# Patient Record
Sex: Female | Born: 1935 | Race: White | Hispanic: No | Smoking: Never smoker
Health system: Southern US, Community
[De-identification: ages and names within clinical notes are randomized; demographics above are authoritative.]

## PROBLEM LIST (undated history)

## (undated) DIAGNOSIS — I6529 Occlusion and stenosis of unspecified carotid artery: Secondary | ICD-10-CM

## (undated) DIAGNOSIS — I10 Essential (primary) hypertension: Secondary | ICD-10-CM

## (undated) DIAGNOSIS — C569 Malignant neoplasm of unspecified ovary: Secondary | ICD-10-CM

## (undated) HISTORY — PX: OOPHORECTOMY: SHX86

## (undated) HISTORY — PX: ABDOMINAL HYSTERECTOMY: SHX81

## (undated) HISTORY — DX: Occlusion and stenosis of unspecified carotid artery: I65.29

## (undated) HISTORY — PX: BREAST BIOPSY: SHX20

---

## 2004-04-13 ENCOUNTER — Ambulatory Visit: Payer: Self-pay | Admitting: Internal Medicine

## 2005-01-10 ENCOUNTER — Ambulatory Visit: Payer: Self-pay | Admitting: Internal Medicine

## 2005-05-02 ENCOUNTER — Ambulatory Visit: Payer: Self-pay | Admitting: General Surgery

## 2006-05-06 ENCOUNTER — Ambulatory Visit: Payer: Self-pay | Admitting: Internal Medicine

## 2006-07-22 ENCOUNTER — Ambulatory Visit: Payer: Self-pay | Admitting: Gastroenterology

## 2007-05-09 ENCOUNTER — Ambulatory Visit: Payer: Self-pay | Admitting: Internal Medicine

## 2008-05-10 ENCOUNTER — Ambulatory Visit: Payer: Self-pay | Admitting: Family Medicine

## 2009-05-23 ENCOUNTER — Ambulatory Visit: Payer: Self-pay | Admitting: Family Medicine

## 2010-05-29 ENCOUNTER — Ambulatory Visit: Payer: Self-pay | Admitting: Family Medicine

## 2010-06-14 ENCOUNTER — Ambulatory Visit: Payer: Self-pay | Admitting: Ophthalmology

## 2011-09-04 ENCOUNTER — Ambulatory Visit: Payer: Self-pay | Admitting: Family Medicine

## 2012-09-09 ENCOUNTER — Ambulatory Visit: Payer: Self-pay | Admitting: Family Medicine

## 2013-09-10 ENCOUNTER — Ambulatory Visit: Payer: Self-pay | Admitting: Family Medicine

## 2013-10-09 DIAGNOSIS — M129 Arthropathy, unspecified: Secondary | ICD-10-CM | POA: Insufficient documentation

## 2013-10-09 DIAGNOSIS — E785 Hyperlipidemia, unspecified: Secondary | ICD-10-CM | POA: Insufficient documentation

## 2013-10-09 DIAGNOSIS — I10 Essential (primary) hypertension: Secondary | ICD-10-CM | POA: Insufficient documentation

## 2013-10-09 DIAGNOSIS — F419 Anxiety disorder, unspecified: Secondary | ICD-10-CM | POA: Insufficient documentation

## 2014-02-17 DIAGNOSIS — B0229 Other postherpetic nervous system involvement: Secondary | ICD-10-CM | POA: Insufficient documentation

## 2014-09-01 ENCOUNTER — Other Ambulatory Visit: Payer: Self-pay | Admitting: Family Medicine

## 2014-09-01 DIAGNOSIS — Z1231 Encounter for screening mammogram for malignant neoplasm of breast: Secondary | ICD-10-CM

## 2014-09-16 ENCOUNTER — Ambulatory Visit
Admission: RE | Admit: 2014-09-16 | Discharge: 2014-09-16 | Disposition: A | Discharge status: Home / Self Care | Payer: PPO | Source: Ambulatory Visit | Attending: Family Medicine | Admitting: Family Medicine

## 2014-09-16 DIAGNOSIS — Z1231 Encounter for screening mammogram for malignant neoplasm of breast: Secondary | ICD-10-CM | POA: Insufficient documentation

## 2014-09-16 HISTORY — DX: Malignant neoplasm of unspecified ovary: C56.9

## 2015-04-29 ENCOUNTER — Encounter: Payer: Self-pay | Admitting: Emergency Medicine

## 2015-04-29 ENCOUNTER — Ambulatory Visit
Admission: EM | Admit: 2015-04-29 | Discharge: 2015-04-29 | Disposition: A | Discharge status: Other Acute Inpatient Hospital | Payer: PPO | Attending: Family Medicine | Admitting: Family Medicine

## 2015-04-29 DIAGNOSIS — S0990XA Unspecified injury of head, initial encounter: Secondary | ICD-10-CM | POA: Diagnosis not present

## 2015-04-29 NOTE — ED Notes (Signed)
Patient states that she was going up the steps and tripped and hit her forehead on the cement floor.  Patient denies HAs.  Patient denies N/V.  Patient c/o pain on the left side of her forehead above her eyebrow.

## 2015-04-29 NOTE — ED Notes (Signed)
Ice Pack applied to forehead.

## 2015-04-29 NOTE — ED Provider Notes (Signed)
Patient presents today after fall outside on cement. Patient states that she tripped about an hour and a half ago outside. She has moderate amount of swelling above her left eye however does not complain of gout much pain. She denies any headache, nausea, vomiting, chest pain, shortness of breath, vision problems. She does take an aspirin daily. She also admits to having a abrasion and small laceration to her right hand. She denies any neck pain or any back pain or hip pain.  ROS: Negative except mentioned above.  Vitals as per Epic.  GENERAL: NAD HEENT: moderate sized hematoma above left eye, PERRL, EOMI, no neck tenderness, no orbital step-off appreciated  RESP: CTA B CARD: RRR SKIN: approx. 0.5in laceration to the right first digit, abrasion along volar aspect of wrist MSK: no extremity tenderness, no back tenderness or hip tenderness NEURO: AAOx3, CN II-XII grossly intact   A/P: Hx of Fall, head/facial trauma, skin laceration/abrasion right hand- discussed with patient that I would recommend a CT of head and maxillofacial area, she will need to go to the ER to have this done, I believe she will need sutures to the right first digit, a dressing was placed over the area, there is someone with her that will take her to the ER now.   Paulina Fusi, MD 04/29/15 [phone]

## 2015-08-18 ENCOUNTER — Other Ambulatory Visit: Payer: Self-pay | Admitting: Family Medicine

## 2015-08-18 DIAGNOSIS — Z1231 Encounter for screening mammogram for malignant neoplasm of breast: Secondary | ICD-10-CM

## 2015-08-25 DIAGNOSIS — E7800 Pure hypercholesterolemia, unspecified: Secondary | ICD-10-CM | POA: Diagnosis not present

## 2015-08-25 DIAGNOSIS — Z23 Encounter for immunization: Secondary | ICD-10-CM | POA: Diagnosis not present

## 2015-08-25 DIAGNOSIS — M129 Arthropathy, unspecified: Secondary | ICD-10-CM | POA: Diagnosis not present

## 2015-08-25 DIAGNOSIS — I10 Essential (primary) hypertension: Secondary | ICD-10-CM | POA: Diagnosis not present

## 2015-08-25 DIAGNOSIS — B0229 Other postherpetic nervous system involvement: Secondary | ICD-10-CM | POA: Diagnosis not present

## 2015-08-25 DIAGNOSIS — F419 Anxiety disorder, unspecified: Secondary | ICD-10-CM | POA: Diagnosis not present

## 2015-09-19 ENCOUNTER — Ambulatory Visit
Admission: RE | Admit: 2015-09-19 | Discharge: 2015-09-19 | Disposition: A | Discharge status: Home / Self Care | Payer: PPO | Source: Ambulatory Visit | Attending: Family Medicine | Admitting: Family Medicine

## 2015-09-19 DIAGNOSIS — Z1231 Encounter for screening mammogram for malignant neoplasm of breast: Secondary | ICD-10-CM | POA: Diagnosis not present

## 2016-02-27 DIAGNOSIS — I10 Essential (primary) hypertension: Secondary | ICD-10-CM | POA: Diagnosis not present

## 2016-02-27 DIAGNOSIS — F419 Anxiety disorder, unspecified: Secondary | ICD-10-CM | POA: Diagnosis not present

## 2016-02-27 DIAGNOSIS — E7800 Pure hypercholesterolemia, unspecified: Secondary | ICD-10-CM | POA: Diagnosis not present

## 2016-02-27 DIAGNOSIS — M129 Arthropathy, unspecified: Secondary | ICD-10-CM | POA: Diagnosis not present

## 2016-02-27 DIAGNOSIS — B0229 Other postherpetic nervous system involvement: Secondary | ICD-10-CM | POA: Diagnosis not present

## 2016-08-27 ENCOUNTER — Other Ambulatory Visit: Payer: Self-pay | Admitting: Family Medicine

## 2016-08-27 DIAGNOSIS — M129 Arthropathy, unspecified: Secondary | ICD-10-CM | POA: Diagnosis not present

## 2016-08-27 DIAGNOSIS — I10 Essential (primary) hypertension: Secondary | ICD-10-CM | POA: Diagnosis not present

## 2016-08-27 DIAGNOSIS — Z1231 Encounter for screening mammogram for malignant neoplasm of breast: Secondary | ICD-10-CM

## 2016-08-27 DIAGNOSIS — E7800 Pure hypercholesterolemia, unspecified: Secondary | ICD-10-CM | POA: Diagnosis not present

## 2016-08-27 DIAGNOSIS — B0229 Other postherpetic nervous system involvement: Secondary | ICD-10-CM | POA: Diagnosis not present

## 2016-08-27 DIAGNOSIS — F419 Anxiety disorder, unspecified: Secondary | ICD-10-CM | POA: Diagnosis not present

## 2016-09-19 ENCOUNTER — Ambulatory Visit
Admission: RE | Admit: 2016-09-19 | Discharge: 2016-09-19 | Disposition: A | Discharge status: Home / Self Care | Payer: PPO | Source: Ambulatory Visit | Attending: Family Medicine | Admitting: Family Medicine

## 2016-09-19 DIAGNOSIS — Z1231 Encounter for screening mammogram for malignant neoplasm of breast: Secondary | ICD-10-CM | POA: Diagnosis not present

## 2016-11-28 ENCOUNTER — Encounter: Payer: Self-pay | Admitting: Emergency Medicine

## 2016-11-28 ENCOUNTER — Emergency Department: Payer: PPO

## 2016-11-28 ENCOUNTER — Emergency Department
Admission: EM | Admit: 2016-11-28 | Discharge: 2016-11-28 | Disposition: A | Discharge status: Home / Self Care | Payer: PPO | Attending: Emergency Medicine | Admitting: Emergency Medicine

## 2016-11-28 DIAGNOSIS — I10 Essential (primary) hypertension: Secondary | ICD-10-CM | POA: Diagnosis not present

## 2016-11-28 DIAGNOSIS — R42 Dizziness and giddiness: Secondary | ICD-10-CM | POA: Insufficient documentation

## 2016-11-28 DIAGNOSIS — Z79899 Other long term (current) drug therapy: Secondary | ICD-10-CM | POA: Insufficient documentation

## 2016-11-28 DIAGNOSIS — R55 Syncope and collapse: Secondary | ICD-10-CM | POA: Diagnosis present

## 2016-11-28 DIAGNOSIS — Z7982 Long term (current) use of aspirin: Secondary | ICD-10-CM | POA: Insufficient documentation

## 2016-11-28 DIAGNOSIS — R112 Nausea with vomiting, unspecified: Secondary | ICD-10-CM | POA: Diagnosis not present

## 2016-11-28 HISTORY — DX: Essential (primary) hypertension: I10

## 2016-11-28 LAB — URINALYSIS, COMPLETE (UACMP) WITH MICROSCOPIC
Bacteria, UA: NONE SEEN
Bilirubin Urine: NEGATIVE
Glucose, UA: NEGATIVE mg/dL
Hgb urine dipstick: NEGATIVE
Ketones, ur: 5 mg/dL — AB
Nitrite: NEGATIVE
PH: 5 (ref 5.0–8.0)
Protein, ur: NEGATIVE mg/dL
SPECIFIC GRAVITY, URINE: 1.016 (ref 1.005–1.030)

## 2016-11-28 LAB — BASIC METABOLIC PANEL
Anion gap: 10 (ref 5–15)
BUN: 29 mg/dL — ABNORMAL HIGH (ref 6–20)
CALCIUM: 9 mg/dL (ref 8.9–10.3)
CHLORIDE: 105 mmol/L (ref 101–111)
CO2: 23 mmol/L (ref 22–32)
CREATININE: 1.02 mg/dL — AB (ref 0.44–1.00)
GFR calc Af Amer: 57 mL/min — ABNORMAL LOW (ref >60)
GFR calc non Af Amer: 49 mL/min — ABNORMAL LOW (ref >60)
Glucose, Bld: 121 mg/dL — ABNORMAL HIGH (ref 65–99)
Potassium: 4.5 mmol/L (ref 3.5–5.1)
SODIUM: 138 mmol/L (ref 135–145)

## 2016-11-28 LAB — CBC
HCT: 42.1 % (ref 35.0–47.0)
Hemoglobin: 14.4 g/dL (ref 12.0–16.0)
MCH: 30.3 pg (ref 26.0–34.0)
MCHC: 34.1 g/dL (ref 32.0–36.0)
MCV: 88.8 fL (ref 80.0–100.0)
PLATELETS: 297 10*3/uL (ref 150–440)
RBC: 4.74 MIL/uL (ref 3.80–5.20)
RDW: 13.4 % (ref 11.5–14.5)
WBC: 10.5 10*3/uL (ref 3.6–11.0)

## 2016-11-28 NOTE — ED Provider Notes (Signed)
[facility] Emergency Department Provider Note  ____________________________________________  Time seen: Approximately 2:40 PM  I have reviewed the triage vital signs and the nursing notes.   HISTORY  Chief Complaint Near Syncope    HPI Dominique King is a 83 y.o. female who reports a brief episode of sudden dizziness described as room spinning earlier today at about 1 PM. She had just eaten and was standing in the kitchen doing some dishes when this happened. She sat down and the episode passed within a few minutes. She did vomit once or twice. This is happened before after eating including once last week. She denies any headache vision change focal paresthesias or weakness. She did not pass out or hit her head. Denies any fevers chills or neck stiffness. No pain anywhere else. Feeling back to normal and wants ginger ale.     Past Medical History:  Diagnosis Date  . Hypertension   . Ovarian cancer ([place])      There are no active problems to display for this patient.    Past Surgical History:  Procedure Laterality Date  . ABDOMINAL HYSTERECTOMY    . BREAST BIOPSY Right    neg-1994  . OOPHORECTOMY       Prior to Admission medications   Medication Sig Start Date End Date Taking? Authorizing Provider  aspirin 81 MG tablet Take 81 mg by mouth daily.    [provider]  gabapentin (NEURONTIN) 300 MG capsule Take 300 mg by mouth at bedtime.    [provider]  Omega-3 Fatty Acids (FISH OIL ADULT GUMMIES PO) Take by mouth.    [provider]  verapamil (VERELAN PM) 240 MG 24 hr capsule Take 240 mg by mouth at bedtime.    [provider]     Allergies Patient has no known allergies.   Family History  Problem Relation Age of Onset  . Breast cancer Mother 53    Social History Social History  Substance Use Topics  . Smoking status: Never Smoker  . Smokeless tobacco: Never Used  . Alcohol use No     Review of Systems  Constitutional:   No fever or chills.  ENT:   No sore throat. No rhinorrhea. Cardiovascular:   No chest pain or syncope. Respiratory:   No dyspnea or cough. Gastrointestinal:   Negative for abdominal pain, vomiting and diarrhea.  Musculoskeletal:   Negative for focal pain or swelling All other systems reviewed and are negative except as documented above in ROS and HPI.  ____________________________________________   PHYSICAL EXAM:  VITAL SIGNS: ED Triage Vitals  Enc Vitals Group     BP 11/28/16 1355 (!) 128/52     Pulse Rate 11/28/16 1355 71     Resp 11/28/16 1355 16     Temp 11/28/16 1355 97.8 F (36.6 C)     Temp Source 11/28/16 1355 Oral     SpO2 11/28/16 1355 93 %     Weight 11/28/16 1349 125 lb (56.7 kg)     Height 11/28/16 1349 5\' 4"  (1.626 m)     Head Circumference --      Peak Flow --      Pain Score --      Pain Loc --      Pain Edu? --      Excl. in [place]? --     Vital signs reviewed, nursing assessments reviewed.   Constitutional:   Alert and oriented. Well appearing and in no distress. Eyes:  No scleral icterus.  EOMI. No nystagmus. No conjunctival pallor. PERRL. ENT   Head:   Normocephalic and atraumatic.   Nose:   No congestion/rhinnorhea.    Mouth/Throat:   MMM, no pharyngeal erythema. No peritonsillar mass.    Neck:   No meningismus. Full ROM Hematological/Lymphatic/Immunilogical:   No cervical lymphadenopathy. Cardiovascular:   RRR. Symmetric bilateral radial and DP pulses.  No murmurs.  Respiratory:   Normal respiratory effort without tachypnea/retractions. Breath sounds are clear and equal bilaterally. No wheezes/rales/rhonchi. Gastrointestinal:   Soft and nontender. Non distended. There is no CVA tenderness.  No rebound, rigidity, or guarding. Genitourinary:   deferred Musculoskeletal:   Normal range of motion in all extremities. No joint effusions.  No lower extremity tenderness.  No edema. Neurologic:    Normal speech and language.  Cranial nerves II through X are intact. No pronator drift. Normal finger to nose. Motor grossly intact. Stroke scale 0 No gross focal neurologic deficits are appreciated.  Skin:    Skin is warm, dry and intact. No rash noted.  No petechiae, purpura, or bullae.  ____________________________________________    LABS (pertinent positives/negatives) (all labs ordered are listed, but only abnormal results are displayed) Labs Reviewed  BASIC METABOLIC PANEL - Abnormal; Notable for the following:       Result Value   Glucose, Bld 121 (*)    BUN 29 (*)    Creatinine, Ser 1.02 (*)    GFR calc non Af Amer 49 (*)    GFR calc Af Amer 57 (*)    All other components within normal limits  CBC  URINALYSIS, COMPLETE (UACMP) WITH MICROSCOPIC  CBG MONITORING, ED   ____________________________________________   EKG  Interpreted by me  Date: 11/28/2016  Rate: 72  Rhythm: normal sinus rhythm  QRS Axis: normal  Intervals: normal  ST/T Wave abnormalities: normal  Conduction Disutrbances: none  Narrative Interpretation: unremarkable      ____________________________________________    RADIOLOGY  No results found.  ____________________________________________   PROCEDURES Procedures  ____________________________________________   INITIAL IMPRESSION / ASSESSMENT AND PLAN / ED COURSE  Pertinent labs & imaging results that were available during my care of the patient were reviewed by me and considered in my medical decision making (see chart for details).  Patient well appearing no acute distress, had a brief episode of vertiginous symptoms. No recent illness to suggest labyrinthitis, highly doubt stroke or vertebrobasilar insufficiency. Has a history of this before after eating, similar circumstances. She is back to normal now, reassuring workup. We'll get a CT head, if negative suitable for outpatient follow-up with primary care. He signed out to  oncoming physician Dr. Cinda Quest pending CT head.      ____________________________________________   FINAL CLINICAL IMPRESSION(S) / ED DIAGNOSES  Final diagnoses:  Dizziness      New Prescriptions   No medications on file     Portions of this note were generated with dragon dictation software. Dictation errors may occur despite best attempts at proofreading.    Carrie Mew, MD 11/28/16 [phone]

## 2016-11-28 NOTE — Discharge Instructions (Addendum)
Please return if worse especially if symptoms last more than a few minutes. Please make sure to follow up with your doctor as planned.

## 2016-11-28 NOTE — ED Triage Notes (Signed)
Pt in via EMS from home with complaints of sudden onset dizziness and light headedness while washing dishes.  Pt reports sitting down feeling as if she may have syncopal episode.  Pt with some N/V prior to arrival.  Pt A/Ox4, denies any complaints at this time.  NAD noted at this time.

## 2016-11-28 NOTE — ED Notes (Signed)
Patient transported to CT 

## 2016-11-28 NOTE — ED Provider Notes (Signed)
IMPRESSION: Normal noncontrast head CT for age.   Electronically Signed   By: Sandi Mariscal M.D.   On: 11/28/2016 15:32  Urinalysis is clear patient feels well patient had episode of room spinning and feeling like she was going to pass out now back to normal discharge is planned.   Nena Polio, MD 11/28/16 [phone]

## 2017-01-09 DIAGNOSIS — N390 Urinary tract infection, site not specified: Secondary | ICD-10-CM | POA: Diagnosis not present

## 2017-01-09 DIAGNOSIS — R300 Dysuria: Secondary | ICD-10-CM | POA: Diagnosis not present

## 2017-01-09 DIAGNOSIS — F5105 Insomnia due to other mental disorder: Secondary | ICD-10-CM | POA: Diagnosis not present

## 2017-01-09 DIAGNOSIS — F4321 Adjustment disorder with depressed mood: Secondary | ICD-10-CM | POA: Diagnosis not present

## 2017-02-18 ENCOUNTER — Other Ambulatory Visit: Payer: Self-pay | Admitting: Pharmacist

## 2017-02-18 NOTE — Patient Outreach (Addendum)
Incoming call from [place] in response to the EMMI Medication Adherence Campaign. Speak with patient. HIPAA identifiers verified and verbal consent received.  Ms. Rohr reports that she takes her pravastatin daily as directed. Denies any missed doses or barriers to taking this medication. Counseled on the importance of adherence to this medication. Denies any current medication questions.  Harlow Asa, PharmD, [facility] [phone]

## 2017-02-28 DIAGNOSIS — F419 Anxiety disorder, unspecified: Secondary | ICD-10-CM | POA: Diagnosis not present

## 2017-02-28 DIAGNOSIS — B0229 Other postherpetic nervous system involvement: Secondary | ICD-10-CM | POA: Diagnosis not present

## 2017-02-28 DIAGNOSIS — Z0000 Encounter for general adult medical examination without abnormal findings: Secondary | ICD-10-CM | POA: Diagnosis not present

## 2017-02-28 DIAGNOSIS — M129 Arthropathy, unspecified: Secondary | ICD-10-CM | POA: Diagnosis not present

## 2017-02-28 DIAGNOSIS — I10 Essential (primary) hypertension: Secondary | ICD-10-CM | POA: Diagnosis not present

## 2017-02-28 DIAGNOSIS — E7800 Pure hypercholesterolemia, unspecified: Secondary | ICD-10-CM | POA: Diagnosis not present

## 2017-03-12 DIAGNOSIS — L309 Dermatitis, unspecified: Secondary | ICD-10-CM | POA: Diagnosis not present

## 2017-03-18 DIAGNOSIS — I872 Venous insufficiency (chronic) (peripheral): Secondary | ICD-10-CM | POA: Diagnosis not present

## 2017-03-18 DIAGNOSIS — L309 Dermatitis, unspecified: Secondary | ICD-10-CM | POA: Diagnosis not present

## 2017-03-18 DIAGNOSIS — R600 Localized edema: Secondary | ICD-10-CM | POA: Diagnosis not present

## 2017-06-17 ENCOUNTER — Other Ambulatory Visit: Payer: Self-pay | Admitting: Nurse Practitioner

## 2017-06-17 DIAGNOSIS — M7989 Other specified soft tissue disorders: Secondary | ICD-10-CM

## 2017-06-17 DIAGNOSIS — M799 Soft tissue disorder, unspecified: Secondary | ICD-10-CM | POA: Diagnosis not present

## 2017-06-20 ENCOUNTER — Ambulatory Visit
Admission: RE | Admit: 2017-06-20 | Discharge: 2017-06-20 | Disposition: A | Discharge status: Home / Self Care | Payer: PPO | Source: Ambulatory Visit | Attending: Nurse Practitioner | Admitting: Nurse Practitioner

## 2017-06-20 DIAGNOSIS — M7989 Other specified soft tissue disorders: Secondary | ICD-10-CM

## 2017-06-20 DIAGNOSIS — M799 Soft tissue disorder, unspecified: Secondary | ICD-10-CM | POA: Diagnosis not present

## 2017-06-20 DIAGNOSIS — N281 Cyst of kidney, acquired: Secondary | ICD-10-CM | POA: Diagnosis not present

## 2017-06-24 DIAGNOSIS — R1909 Other intra-abdominal and pelvic swelling, mass and lump: Secondary | ICD-10-CM | POA: Diagnosis not present

## 2017-07-22 DIAGNOSIS — Z79899 Other long term (current) drug therapy: Secondary | ICD-10-CM | POA: Diagnosis not present

## 2017-07-22 DIAGNOSIS — I25118 Atherosclerotic heart disease of native coronary artery with other forms of angina pectoris: Secondary | ICD-10-CM | POA: Diagnosis not present

## 2017-08-14 ENCOUNTER — Other Ambulatory Visit: Payer: Self-pay | Admitting: Family Medicine

## 2017-08-14 DIAGNOSIS — Z1231 Encounter for screening mammogram for malignant neoplasm of breast: Secondary | ICD-10-CM

## 2017-09-10 DIAGNOSIS — F419 Anxiety disorder, unspecified: Secondary | ICD-10-CM | POA: Diagnosis not present

## 2017-09-10 DIAGNOSIS — E7800 Pure hypercholesterolemia, unspecified: Secondary | ICD-10-CM | POA: Diagnosis not present

## 2017-09-10 DIAGNOSIS — I10 Essential (primary) hypertension: Secondary | ICD-10-CM | POA: Diagnosis not present

## 2017-09-10 DIAGNOSIS — R0989 Other specified symptoms and signs involving the circulatory and respiratory systems: Secondary | ICD-10-CM | POA: Diagnosis not present

## 2017-09-10 DIAGNOSIS — B0229 Other postherpetic nervous system involvement: Secondary | ICD-10-CM | POA: Diagnosis not present

## 2017-09-10 DIAGNOSIS — M129 Arthropathy, unspecified: Secondary | ICD-10-CM | POA: Diagnosis not present

## 2017-09-11 DIAGNOSIS — E7800 Pure hypercholesterolemia, unspecified: Secondary | ICD-10-CM | POA: Diagnosis not present

## 2017-09-23 DIAGNOSIS — I10 Essential (primary) hypertension: Secondary | ICD-10-CM | POA: Diagnosis not present

## 2017-09-23 DIAGNOSIS — R0989 Other specified symptoms and signs involving the circulatory and respiratory systems: Secondary | ICD-10-CM | POA: Diagnosis not present

## 2017-09-24 DIAGNOSIS — I6523 Occlusion and stenosis of bilateral carotid arteries: Secondary | ICD-10-CM | POA: Diagnosis not present

## 2017-09-25 ENCOUNTER — Ambulatory Visit
Admission: RE | Admit: 2017-09-25 | Discharge: 2017-09-25 | Disposition: A | Discharge status: Home / Self Care | Payer: PPO | Source: Ambulatory Visit | Attending: Family Medicine | Admitting: Family Medicine

## 2017-09-25 ENCOUNTER — Inpatient Hospital Stay: Admission: RE | Admit: 2017-09-25 | Payer: PPO | Source: Ambulatory Visit

## 2017-09-25 DIAGNOSIS — Z1231 Encounter for screening mammogram for malignant neoplasm of breast: Secondary | ICD-10-CM | POA: Diagnosis not present

## 2017-10-28 ENCOUNTER — Encounter (INDEPENDENT_AMBULATORY_CARE_PROVIDER_SITE_OTHER): Payer: PPO | Admitting: Vascular Surgery

## 2017-10-28 DIAGNOSIS — I6529 Occlusion and stenosis of unspecified carotid artery: Secondary | ICD-10-CM | POA: Insufficient documentation

## 2017-10-28 DIAGNOSIS — I679 Cerebrovascular disease, unspecified: Secondary | ICD-10-CM | POA: Insufficient documentation

## 2017-10-28 DIAGNOSIS — E785 Hyperlipidemia, unspecified: Secondary | ICD-10-CM | POA: Insufficient documentation

## 2017-10-28 DIAGNOSIS — I10 Essential (primary) hypertension: Secondary | ICD-10-CM | POA: Insufficient documentation

## 2017-10-28 NOTE — Progress Notes (Deleted)
MRN : [id number]  Dominique King is a 84 y.o. (05/18/1930) female who presents with chief complaint of No chief complaint on file. Marland Kitchen  History of Present Illness:   The patient is seen for evaluation of carotid stenosis. The carotid stenosis was identified after ***.  The patient denies amaurosis fugax. There is no recent history of TIA symptoms or focal motor deficits. There is no prior documented CVA.  There is no history of migraine headaches. There is no history of seizures.  The patient is taking enteric-coated aspirin 81 mg daily.  The patient has a history of coronary artery disease, no recent episodes of angina or shortness of breath. The patient denies PAD or claudication symptoms. There is a history of hyperlipidemia which is being treated with a statin.    No outpatient medications have been marked as taking for the 10/28/17 encounter (Appointment) with Delana Meyer, Dolores Lory, MD.    Past Medical History:  Diagnosis Date  . Hypertension   . Ovarian cancer [facility])     Past Surgical History:  Procedure Laterality Date  . ABDOMINAL HYSTERECTOMY    . BREAST BIOPSY Right    neg-1994  . OOPHORECTOMY      Social History Social History   Tobacco Use  . Smoking status: Never Smoker  . Smokeless tobacco: Never Used  Substance Use Topics  . Alcohol use: No  . Drug use: No    Family History Family History  Problem Relation Age of Onset  . Breast cancer Mother 68  No family history of bleeding/clotting disorders, porphyria or autoimmune disease   No Known Allergies   REVIEW OF SYSTEMS (Negative unless checked)  Constitutional: [] Weight loss  [] Fever  [] Chills Cardiac: [] Chest pain   [] Chest pressure   [] Palpitations   [] Shortness of breath when laying flat   [] Shortness of breath with exertion. Vascular:  [] Pain in legs with walking   [] Pain in legs at rest  [] History of DVT   [] Phlebitis   [] Swelling in legs   [] Varicose veins   [] Non-healing  ulcers Pulmonary:   [] Uses home oxygen   [] Productive cough   [] Hemoptysis   [] Wheeze  [] COPD   [] Asthma Neurologic:  [] Dizziness   [] Seizures   [] History of stroke   [] History of TIA  [] Aphasia   [] Vissual changes   [] Weakness or numbness in arm   [] Weakness or numbness in leg Musculoskeletal:   [] Joint swelling   [] Joint pain   [] Low back pain Hematologic:  [] Easy bruising  [] Easy bleeding   [] Hypercoagulable state   [] Anemic Gastrointestinal:  [] Diarrhea   [] Vomiting  [] Gastroesophageal reflux/heartburn   [] Difficulty swallowing. Genitourinary:  [] Chronic kidney disease   [] Difficult urination  [] Frequent urination   [] Blood in urine Skin:  [] Rashes   [] Ulcers  Psychological:  [] History of anxiety   []  History of major depression.  Physical Examination  There were no vitals filed for this visit. There is no height or weight on file to calculate BMI. Gen: WD/WN, NAD Head: [place]/AT, No temporalis wasting.  Ear/Nose/Throat: Hearing grossly intact, nares w/o erythema or drainage, poor dentition Eyes: PER, EOMI, sclera nonicteric.  Neck: Supple, no masses.  No bruit or JVD.  Pulmonary:  Good air movement, clear to auscultation bilaterally, no use of accessory muscles.  Cardiac: RRR, normal S1, S2, no Murmurs. Vascular: *** Vessel Right Left  Radial Palpable Palpable  Ulnar Palpable Palpable  Brachial Palpable Palpable  Carotid Palpable Palpable  Femoral Palpable Palpable  Popliteal Palpable Palpable  PT Palpable Palpable  DP Palpable Palpable   Gastrointestinal: soft, non-distended. No guarding/no peritoneal signs.  Musculoskeletal: M/S 5/5 throughout.  No deformity or atrophy.  Neurologic: CN 2-12 intact. Pain and light touch intact in extremities.  Symmetrical.  Speech is fluent. Motor exam as listed above. Psychiatric: Judgment intact, Mood & affect appropriate for pt's clinical situation. Dermatologic: No rashes or ulcers noted.  No changes consistent with cellulitis. Lymph : No  Cervical lymphadenopathy, no lichenification or skin changes of chronic lymphedema.  CBC Lab Results  Component Value Date   WBC 10.5 11/28/2016   HGB 14.4 11/28/2016   HCT 42.1 11/28/2016   MCV 88.8 11/28/2016   PLT 297 11/28/2016    BMET    Component Value Date/Time   NA 138 11/28/2016 1349   K 4.5 11/28/2016 1349   CL 105 11/28/2016 1349   CO2 23 11/28/2016 1349   GLUCOSE 121 (H) 11/28/2016 1349   BUN 29 (H) 11/28/2016 1349   CREATININE 1.02 (H) 11/28/2016 1349   CALCIUM 9.0 11/28/2016 1349   GFRNONAA 49 (L) 11/28/2016 1349   GFRAA 57 (L) 11/28/2016 1349   CrCl cannot be calculated (Patient's most recent lab result is older than the maximum 21 days allowed.).  COAG No results found for: INR, PROTIME  Radiology No results found.   Assessment/Plan 1. Intracranial vascular stenosis ***  2. Bilateral carotid artery stenosis ***  3. Essential hypertension ***  4. Hyperlipidemia, unspecified hyperlipidemia type ***    Hortencia Pilar, MD  10/28/2017 8:56 AM

## 2017-10-30 ENCOUNTER — Encounter (INDEPENDENT_AMBULATORY_CARE_PROVIDER_SITE_OTHER): Payer: Self-pay | Admitting: Vascular Surgery

## 2017-10-30 ENCOUNTER — Ambulatory Visit (INDEPENDENT_AMBULATORY_CARE_PROVIDER_SITE_OTHER): Payer: PPO | Admitting: Vascular Surgery

## 2017-10-30 VITALS — BP 150/61 | HR 80 | Resp 12 | Ht 64.0 in | Wt 129.0 lb

## 2017-10-30 DIAGNOSIS — E785 Hyperlipidemia, unspecified: Secondary | ICD-10-CM

## 2017-10-30 DIAGNOSIS — I10 Essential (primary) hypertension: Secondary | ICD-10-CM | POA: Diagnosis not present

## 2017-10-30 DIAGNOSIS — I6523 Occlusion and stenosis of bilateral carotid arteries: Secondary | ICD-10-CM | POA: Diagnosis not present

## 2017-10-30 NOTE — Progress Notes (Signed)
Subjective:    Patient ID: Dominique King, female    DOB: [birth year], 84 y.o.   MRN: [id number] Chief Complaint  Patient presents with  . New Patient (Initial Visit)    Left Carotid blockage   Presents as a new patient referred by Dr. Ellison Hughs for evaluation of carotid stenosis.  Patient seen with daughter.  Patient with audible left carotid bruit on exam.  The patient underwent a bilateral carotid duplex performed at [facility] clinic and read by [facility] radiology which was notable for moderate amount of right-sided atherosclerotic plaque resulting in borderline elevated peak systolic velocities with the right internal carotid artery which approached the 50% luminal narrowing range.  Moderate amount of left-sided atherosclerotic plaque not definitively resulting in a hemodynamically significant stenosis. Right ICA: 122/30 and Left ICA: 116/28.  Vertebral arteries antegrade. The patient denies experiencing Amaurosis Fugax, TIA like symptoms or focal motor deficits.  Patient denies any claudication-like symptoms, rest pain or ulceration to the bilateral lower extremity.  Patient denies any fever, nausea vomiting.  Review of Systems  Constitutional: Negative.   HENT: Negative.   Eyes: Negative.   Respiratory: Negative.   Cardiovascular: Negative.   Gastrointestinal: Negative.   Endocrine: Negative.   Genitourinary: Negative.   Musculoskeletal: Negative.   Skin: Negative.   Allergic/Immunologic: Negative.   Neurological: Negative.   Hematological: Negative.   Psychiatric/Behavioral: Negative.       Objective:   Physical Exam  Constitutional: She is oriented to person, place, and time. She appears well-developed and well-nourished. No distress.  HENT:  Head: Normocephalic and atraumatic.  Right Ear: External ear normal.  Left Ear: External ear normal.  Eyes: Pupils are equal, round, and reactive to light. Conjunctivae and EOM are normal.  Neck: Normal range of motion.  Left  carotid bruit noted.  Cardiovascular: Normal rate, regular rhythm, normal heart sounds and intact distal pulses.  Pulses:      Radial pulses are 2+ on the right side, and 2+ on the left side.  Pulmonary/Chest: Effort normal and breath sounds normal.  Musculoskeletal: Normal range of motion. She exhibits no edema.  Neurological: She is alert and oriented to person, place, and time.  Skin: Skin is warm and dry. She is not diaphoretic.  Psychiatric: She has a normal mood and affect. Her behavior is normal. Judgment and thought content normal.  Vitals reviewed.  BP (!) 150/61 (BP Location: Right Arm, Patient Position: Sitting)   Pulse 80   Resp 12   Ht 5\' 4"  (1.626 m)   Wt 129 lb (58.5 kg)   BMI 22.14 kg/m   Past Medical History:  Diagnosis Date  . Carotid artery occlusion   . Hypertension   . Ovarian cancer [facility])    Social History   Socioeconomic History  . Marital status: Widowed    Spouse name: Not on file  . Number of children: Not on file  . Years of education: Not on file  . Highest education level: Not on file  Occupational History  . Not on file  Social Needs  . Financial resource strain: Not on file  . Food insecurity:    Worry: Not on file    Inability: Not on file  . Transportation needs:    Medical: Not on file    Non-medical: Not on file  Tobacco Use  . Smoking status: Never Smoker  . Smokeless tobacco: Never Used  Substance and Sexual Activity  . Alcohol use: No  . Drug use: No  .  Sexual activity: Never  Lifestyle  . Physical activity:    Days per week: Not on file    Minutes per session: Not on file  . Stress: Not on file  Relationships  . Social connections:    Talks on phone: Not on file    Gets together: Not on file    Attends religious service: Not on file    Active member of club or organization: Not on file    Attends meetings of clubs or organizations: Not on file    Relationship status: Not on file  . Intimate partner violence:     Fear of current or ex partner: Not on file    Emotionally abused: Not on file    Physically abused: Not on file    Forced sexual activity: Not on file  Other Topics Concern  . Not on file  Social History Narrative  . Not on file   Past Surgical History:  Procedure Laterality Date  . ABDOMINAL HYSTERECTOMY    . BREAST BIOPSY Right    neg-1994  . OOPHORECTOMY     Family History  Problem Relation Age of Onset  . Breast cancer Mother 14   Allergies  Allergen Reactions  . Atorvastatin     Other reaction(s): Muscle Pain, Other (See Comments)  . Raloxifene     Other reaction(s): Other (See Comments) Hot flashes  . Codeine Rash      Assessment & Plan:  Presents as a new patient referred by Dr. Ellison Hughs for evaluation of carotid stenosis.  Patient seen with daughter.  Patient with audible left carotid bruit on exam.  The patient underwent a bilateral carotid duplex performed at [facility] clinic and read by [facility] radiology which was notable for moderate amount of right-sided atherosclerotic plaque resulting in borderline elevated peak systolic velocities with the right internal carotid artery which approached the 50% luminal narrowing range.  Moderate amount of left-sided atherosclerotic plaque not definitively resulting in a hemodynamically significant stenosis. Right ICA: 122/30 and Left ICA: 116/28.  Vertebral arteries antegrade. The patient denies experiencing Amaurosis Fugax, TIA like symptoms or focal motor deficits.  Patient denies any claudication-like symptoms, rest pain or ulceration to the bilateral lower extremity.  Patient denies any fever, nausea vomiting.  1. Bilateral carotid artery stenosis - New Studies reviewed with patient. Patient asymptomatic with stable duplex.  No intervention at this time.  Patient to return in six months for surveillance carotid duplex.  If stable we can move out follow-up further. Patient to continue medical optimization with ASA and  dyslipidemia medication. Patient to remain abstinent of tobacco use. I have discussed with the patient at length the risk factors for and pathogenesis of atherosclerotic disease and encouraged a healthy diet, regular exercise regimen and blood pressure / glucose control.  Patient was instructed to contact our office in the interim with problems such as arm / leg weakness or numbness, speech / swallowing difficulty or temporary monocular blindness. The patient expresses their understanding.   - VAS US CAROTID; Future  2. Hyperlipidemia, unspecified hyperlipidemia type - Stable Encouraged good control as its slows the progression of atherosclerotic disease On ASA and statin  3. Essential hypertension - Stable Encouraged good control as its slows the progression of atherosclerotic disease  Current Outpatient Medications on File Prior to Visit  Medication Sig Dispense Refill  . aspirin 81 MG tablet Take 81 mg by mouth daily.    . citalopram (CELEXA) 20 MG tablet Take 20 mg by mouth daily.    Marland Kitchen  gabapentin (NEURONTIN) 300 MG capsule Take 600 mg by mouth at bedtime.     Marland Kitchen ibuprofen (ADVIL,MOTRIN) 200 MG tablet Take by mouth.    . Omega-3 Fatty Acids (FISH OIL ADULT GUMMIES PO) Take by mouth.    . pravastatin (PRAVACHOL) 10 MG tablet Take 10 mg by mouth daily.    . verapamil (VERELAN PM) 240 MG 24 hr capsule Take 240 mg by mouth at bedtime.     No current facility-administered medications on file prior to visit.    There are no Patient Instructions on file for this visit. No follow-ups on file.  Mersedes Alber A Shayma Pfefferle, PA-C

## 2018-02-27 DIAGNOSIS — J3489 Other specified disorders of nose and nasal sinuses: Secondary | ICD-10-CM | POA: Diagnosis not present

## 2018-03-13 DIAGNOSIS — M129 Arthropathy, unspecified: Secondary | ICD-10-CM | POA: Diagnosis not present

## 2018-03-13 DIAGNOSIS — F419 Anxiety disorder, unspecified: Secondary | ICD-10-CM | POA: Diagnosis not present

## 2018-03-13 DIAGNOSIS — I10 Essential (primary) hypertension: Secondary | ICD-10-CM | POA: Diagnosis not present

## 2018-03-13 DIAGNOSIS — E7800 Pure hypercholesterolemia, unspecified: Secondary | ICD-10-CM | POA: Diagnosis not present

## 2018-03-14 DIAGNOSIS — E7800 Pure hypercholesterolemia, unspecified: Secondary | ICD-10-CM | POA: Diagnosis not present

## 2018-05-02 ENCOUNTER — Ambulatory Visit (INDEPENDENT_AMBULATORY_CARE_PROVIDER_SITE_OTHER): Payer: PPO | Admitting: Vascular Surgery

## 2018-05-02 ENCOUNTER — Encounter (INDEPENDENT_AMBULATORY_CARE_PROVIDER_SITE_OTHER): Payer: Self-pay | Admitting: Vascular Surgery

## 2018-05-02 ENCOUNTER — Ambulatory Visit (INDEPENDENT_AMBULATORY_CARE_PROVIDER_SITE_OTHER): Payer: PPO

## 2018-05-02 VITALS — BP 133/61 | HR 75 | Resp 12 | Ht 64.0 in | Wt 125.8 lb

## 2018-05-02 DIAGNOSIS — I10 Essential (primary) hypertension: Secondary | ICD-10-CM

## 2018-05-02 DIAGNOSIS — I6523 Occlusion and stenosis of bilateral carotid arteries: Secondary | ICD-10-CM

## 2018-05-02 DIAGNOSIS — E785 Hyperlipidemia, unspecified: Secondary | ICD-10-CM | POA: Diagnosis not present

## 2018-05-02 NOTE — Assessment & Plan Note (Signed)
blood pressure control important in reducing the progression of atherosclerotic disease. On appropriate oral medications.  

## 2018-05-02 NOTE — Assessment & Plan Note (Signed)
Carotid duplex today reveals velocities just into the 40 to 59% range on the right and in the 1 to 39% range on the left.  Calcific plaque is present bilaterally. At this point, she remains asymptomatic.  She is on appropriate medical therapy with aspirin and a statin agent.  No role for intervention at this degree of stenosis.  Recheck in 1 year with duplex or sooner if problems develop in the interim.

## 2018-05-02 NOTE — Progress Notes (Signed)
MRN : [id number]  Dominique King is a 84 y.o. (22-Jul-1930) female who presents with chief complaint of  Chief Complaint  Patient presents with  . Follow-up  .  History of Present Illness: Patient returns in follow-up of her carotid disease.  She is doing well without new complaints.  She denies any focal neurologic symptoms. Specifically, the patient denies amaurosis fugax, speech or swallowing difficulties, or arm or leg weakness or numbness.  Carotid duplex today reveals velocities just into the 40 to 59% range on the right and in the 1 to 39% range on the left.  Calcific plaque is present bilaterally.  Current Outpatient Medications  Medication Sig Dispense Refill  . aspirin 81 MG tablet Take 81 mg by mouth daily.    . citalopram (CELEXA) 20 MG tablet Take 20 mg by mouth daily.    Marland Kitchen gabapentin (NEURONTIN) 300 MG capsule Take 600 mg by mouth at bedtime.     Marland Kitchen ibuprofen (ADVIL,MOTRIN) 200 MG tablet Take by mouth.    . Omega-3 Fatty Acids (FISH OIL ADULT GUMMIES PO) Take by mouth.    . pravastatin (PRAVACHOL) 10 MG tablet Take 10 mg by mouth daily.    . verapamil (VERELAN PM) 240 MG 24 hr capsule Take 240 mg by mouth at bedtime.     No current facility-administered medications for this visit.     Past Medical History:  Diagnosis Date  . Carotid artery occlusion   . Hypertension   . Ovarian cancer [facility])     Past Surgical History:  Procedure Laterality Date  . ABDOMINAL HYSTERECTOMY    . BREAST BIOPSY Right    neg-1994  . OOPHORECTOMY      Social History Social History   Tobacco Use  . Smoking status: Never Smoker  . Smokeless tobacco: Never Used  Substance Use Topics  . Alcohol use: No  . Drug use: No    Family History Family History  Problem Relation Age of Onset  . Breast cancer Mother 28    Allergies  Allergen Reactions  . Atorvastatin     Other reaction(s): Muscle Pain, Other (See Comments)  . Raloxifene     Other reaction(s): Other (See  Comments) Hot flashes  . Codeine Rash     REVIEW OF SYSTEMS (Negative unless checked)  Constitutional: [] Weight loss  [] Fever  [] Chills Cardiac: [] Chest pain   [] Chest pressure   [] Palpitations   [] Shortness of breath when laying flat   [] Shortness of breath at rest   [] Shortness of breath with exertion. Vascular:  [] Pain in legs with walking   [] Pain in legs at rest   [] Pain in legs when laying flat   [] Claudication   [] Pain in feet when walking  [] Pain in feet at rest  [] Pain in feet when laying flat   [] History of DVT   [] Phlebitis   [] Swelling in legs   [] Varicose veins   [] Non-healing ulcers Pulmonary:   [] Uses home oxygen   [] Productive cough   [] Hemoptysis   [] Wheeze  [] COPD   [] Asthma Neurologic:  [] Dizziness  [] Blackouts   [] Seizures   [] History of stroke   [] History of TIA  [] Aphasia   [] Temporary blindness   [] Dysphagia   [] Weakness or numbness in arms   [] Weakness or numbness in legs Musculoskeletal:  [] Arthritis   [] Joint swelling   [] Joint pain   [] Low back pain Hematologic:  [] Easy bruising  [] Easy bleeding   [] Hypercoagulable state   [] Anemic  [] Hepatitis Gastrointestinal:  [] Blood  in stool   [] Vomiting blood  [] Gastroesophageal reflux/heartburn   [] Difficulty swallowing. Genitourinary:  [] Chronic kidney disease   [] Difficult urination  [] Frequent urination  [] Burning with urination   [] Blood in urine Skin:  [] Rashes   [] Ulcers   [] Wounds Psychological:  [] History of anxiety   []  History of major depression.  Physical Examination  Vitals:   05/02/18 1350  BP: 133/61  Pulse: 75  Resp: 12  Weight: 125 lb 12.8 oz (57.1 kg)  Height: 5\' 4"  (1.626 m)   Body mass index is 21.59 kg/m. Gen:  WD/WN, NAD.  Appears younger than stated age Head: [place]/AT, No temporalis wasting. Ear/Nose/Throat: Hearing grossly intact, nares w/o erythema or drainage, trachea midline Eyes: Conjunctiva clear. Sclera non-icteric Neck: Supple.  No bruit  Pulmonary:  Good air movement, equal and clear  to auscultation bilaterally.  Cardiac: RRR, No JVD Vascular:  Vessel Right Left  Radial Palpable Palpable               Musculoskeletal: M/S 5/5 throughout.  No deformity or atrophy.  No edema. Neurologic: CN 2-12 intact. Sensation grossly intact in extremities.  Symmetrical.  Speech is fluent. Motor exam as listed above. Psychiatric: Judgment intact, Mood & affect appropriate for pt's clinical situation. Dermatologic: No rashes or ulcers noted.  No cellulitis or open wounds.      CBC Lab Results  Component Value Date   WBC 10.5 11/28/2016   HGB 14.4 11/28/2016   HCT 42.1 11/28/2016   MCV 88.8 11/28/2016   PLT 297 11/28/2016    BMET    Component Value Date/Time   NA 138 11/28/2016 1349   K 4.5 11/28/2016 1349   CL 105 11/28/2016 1349   CO2 23 11/28/2016 1349   GLUCOSE 121 (H) 11/28/2016 1349   BUN 29 (H) 11/28/2016 1349   CREATININE 1.02 (H) 11/28/2016 1349   CALCIUM 9.0 11/28/2016 1349   GFRNONAA 49 (L) 11/28/2016 1349   GFRAA 57 (L) 11/28/2016 1349   CrCl cannot be calculated (Patient's most recent lab result is older than the maximum 21 days allowed.).  COAG No results found for: INR, PROTIME  Radiology No results found.   Assessment/Plan Hyperlipidemia lipid control important in reducing the progression of atherosclerotic disease. Continue statin therapy   Essential hypertension blood pressure control important in reducing the progression of atherosclerotic disease. On appropriate oral medications.   Carotid stenosis Carotid duplex today reveals velocities just into the 40 to 59% range on the right and in the 1 to 39% range on the left.  Calcific plaque is present bilaterally. At this point, she remains asymptomatic.  She is on appropriate medical therapy with aspirin and a statin agent.  No role for intervention at this degree of stenosis.  Recheck in 1 year with duplex or sooner if problems develop in the interim.    Leotis Pain,  MD  05/02/2018 2:27 PM    This note was created with Dragon medical transcription system.  Any errors from dictation are purely unintentional

## 2018-05-02 NOTE — Assessment & Plan Note (Signed)
lipid control important in reducing the progression of atherosclerotic disease. Continue statin therapy  

## 2018-09-15 ENCOUNTER — Other Ambulatory Visit: Payer: Self-pay | Admitting: Family Medicine

## 2018-09-15 DIAGNOSIS — Z0000 Encounter for general adult medical examination without abnormal findings: Secondary | ICD-10-CM | POA: Diagnosis not present

## 2018-09-15 DIAGNOSIS — I10 Essential (primary) hypertension: Secondary | ICD-10-CM | POA: Diagnosis not present

## 2018-09-15 DIAGNOSIS — E7800 Pure hypercholesterolemia, unspecified: Secondary | ICD-10-CM | POA: Diagnosis not present

## 2018-09-15 DIAGNOSIS — Z1231 Encounter for screening mammogram for malignant neoplasm of breast: Secondary | ICD-10-CM

## 2018-09-15 DIAGNOSIS — M129 Arthropathy, unspecified: Secondary | ICD-10-CM | POA: Diagnosis not present

## 2018-09-15 DIAGNOSIS — F419 Anxiety disorder, unspecified: Secondary | ICD-10-CM | POA: Diagnosis not present

## 2018-10-30 IMAGING — MG MM DIGITAL SCREENING BILAT W/ CAD
4 series · 4 of 4 positions shown · non-contrast
Comparison: Previous exam(s).

CLINICAL DATA: Screening.

EXAM:
DIGITAL SCREENING BILATERAL MAMMOGRAM WITH CAD

[L CC]
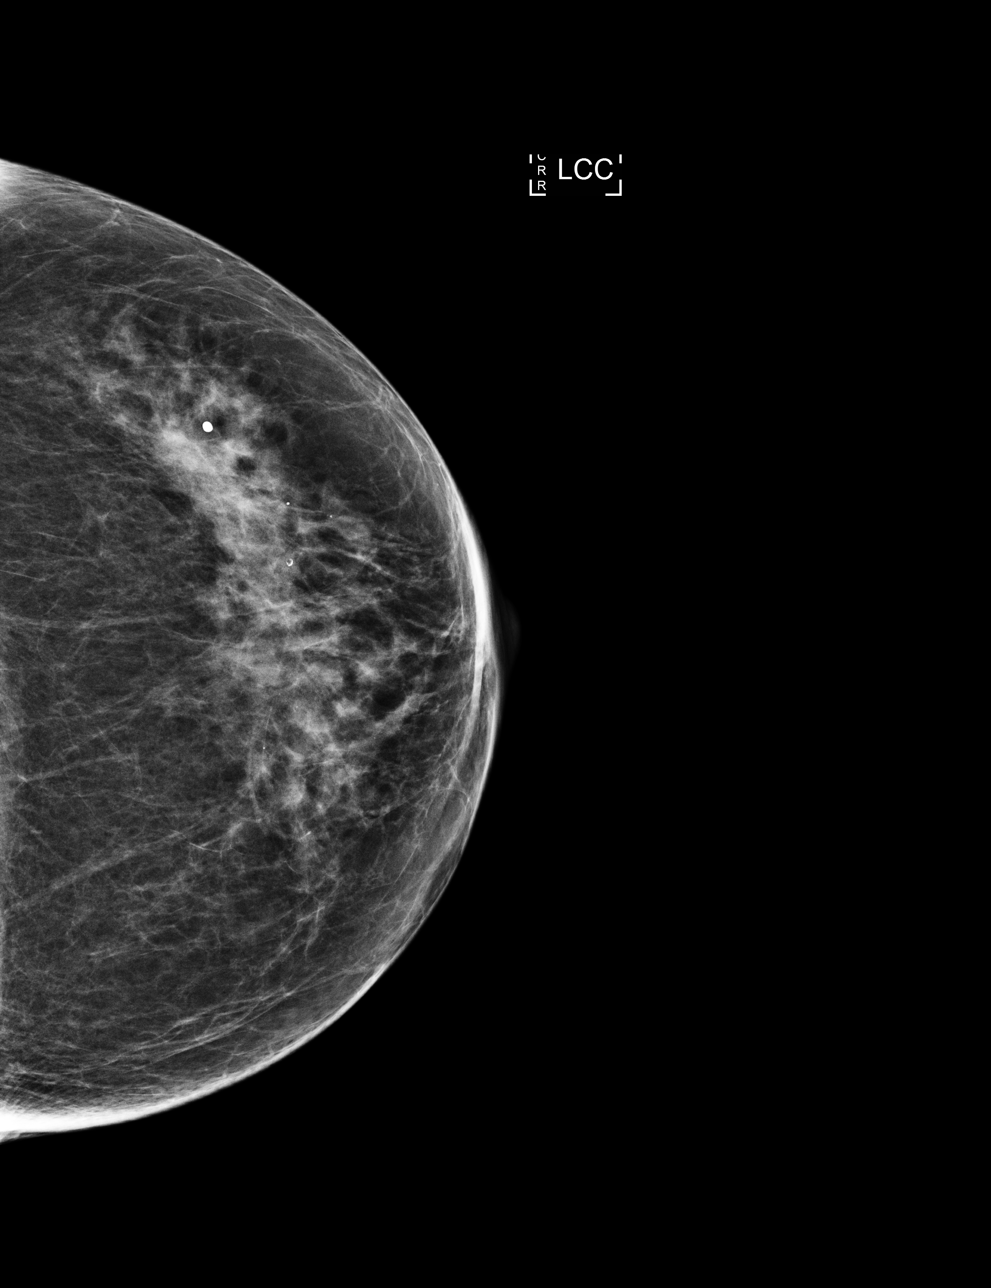

[R MLO]
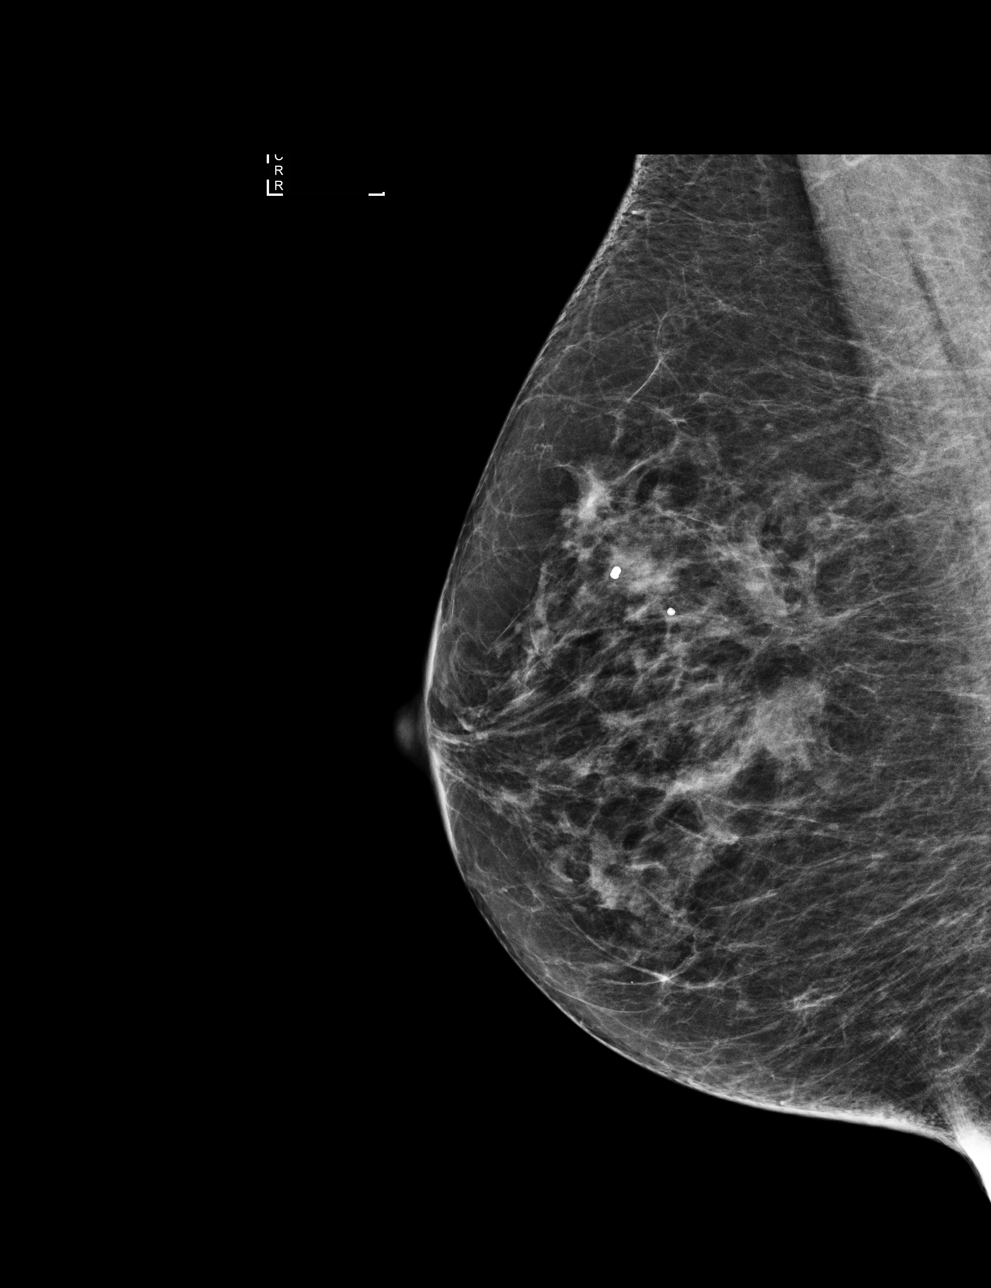

[L MLO]
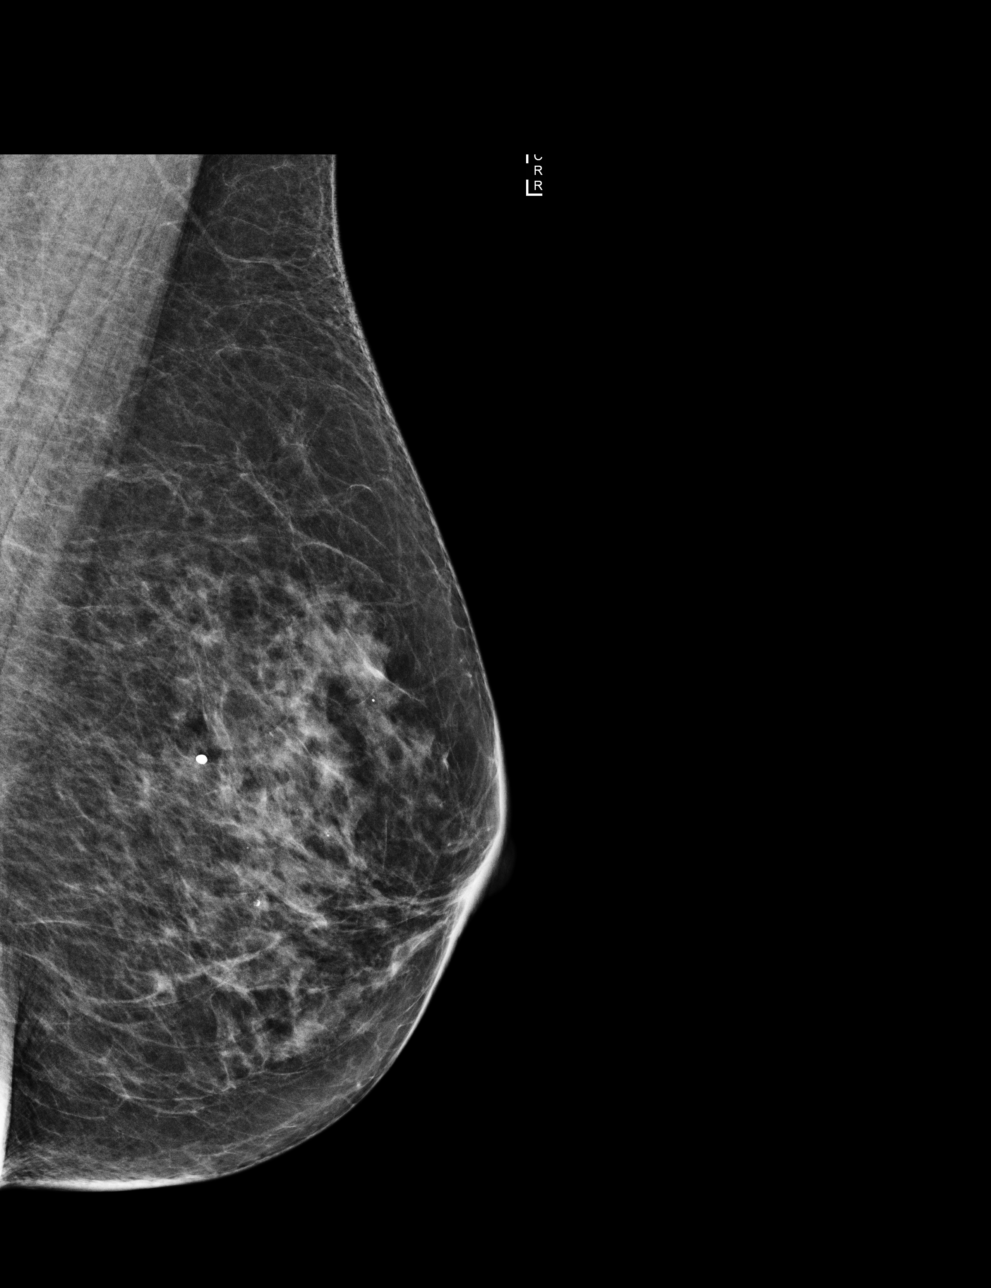

[R CC]
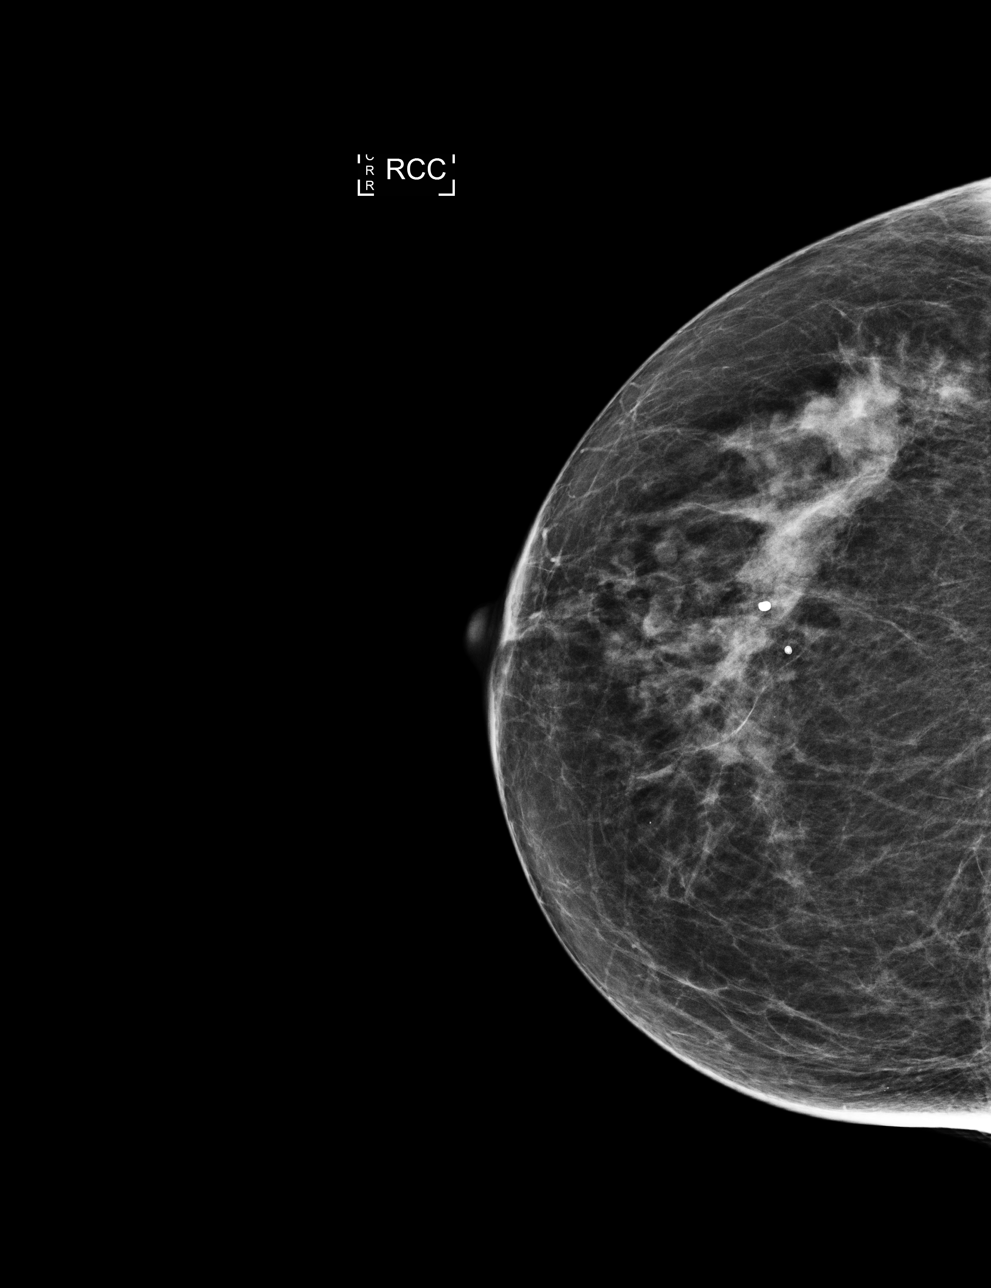

[4 of 4 positions shown; findings below may reference images not displayed]

ACR Breast Density Category b: There are scattered areas of
fibroglandular density.
FINDINGS: There are no findings suspicious for malignancy. Images were
processed with CAD.
IMPRESSION: No mammographic evidence of malignancy. A result letter of this
screening mammogram will be mailed directly to the patient.

RECOMMENDATION:
Screening mammogram in one year. (Code:AS-G-LCT)

BI-RADS CATEGORY  1: Negative.

## 2018-11-06 ENCOUNTER — Other Ambulatory Visit: Payer: Self-pay

## 2018-11-06 ENCOUNTER — Encounter (INDEPENDENT_AMBULATORY_CARE_PROVIDER_SITE_OTHER): Payer: Self-pay

## 2018-11-06 ENCOUNTER — Ambulatory Visit
Admission: RE | Admit: 2018-11-06 | Discharge: 2018-11-06 | Disposition: A | Discharge status: Home / Self Care | Payer: PPO | Source: Ambulatory Visit | Attending: Family Medicine | Admitting: Family Medicine

## 2018-11-06 DIAGNOSIS — Z1231 Encounter for screening mammogram for malignant neoplasm of breast: Secondary | ICD-10-CM | POA: Insufficient documentation

## 2019-03-18 DIAGNOSIS — E7800 Pure hypercholesterolemia, unspecified: Secondary | ICD-10-CM | POA: Diagnosis not present

## 2019-03-18 DIAGNOSIS — F419 Anxiety disorder, unspecified: Secondary | ICD-10-CM | POA: Diagnosis not present

## 2019-03-18 DIAGNOSIS — I10 Essential (primary) hypertension: Secondary | ICD-10-CM | POA: Diagnosis not present

## 2019-03-18 DIAGNOSIS — M129 Arthropathy, unspecified: Secondary | ICD-10-CM | POA: Diagnosis not present

## 2019-05-05 ENCOUNTER — Encounter (INDEPENDENT_AMBULATORY_CARE_PROVIDER_SITE_OTHER): Payer: PPO

## 2019-05-05 ENCOUNTER — Ambulatory Visit (INDEPENDENT_AMBULATORY_CARE_PROVIDER_SITE_OTHER): Payer: PPO | Admitting: Nurse Practitioner

## 2019-09-23 DIAGNOSIS — F419 Anxiety disorder, unspecified: Secondary | ICD-10-CM | POA: Diagnosis not present

## 2019-09-23 DIAGNOSIS — Z0000 Encounter for general adult medical examination without abnormal findings: Secondary | ICD-10-CM | POA: Diagnosis not present

## 2019-09-23 DIAGNOSIS — E7800 Pure hypercholesterolemia, unspecified: Secondary | ICD-10-CM | POA: Diagnosis not present

## 2019-09-23 DIAGNOSIS — I10 Essential (primary) hypertension: Secondary | ICD-10-CM | POA: Diagnosis not present

## 2019-09-23 DIAGNOSIS — I6523 Occlusion and stenosis of bilateral carotid arteries: Secondary | ICD-10-CM | POA: Diagnosis not present

## 2019-09-23 DIAGNOSIS — M129 Arthropathy, unspecified: Secondary | ICD-10-CM | POA: Diagnosis not present

## 2019-10-09 ENCOUNTER — Other Ambulatory Visit: Payer: Self-pay | Admitting: Family Medicine

## 2019-10-09 DIAGNOSIS — Z1231 Encounter for screening mammogram for malignant neoplasm of breast: Secondary | ICD-10-CM

## 2019-10-15 ENCOUNTER — Other Ambulatory Visit (INDEPENDENT_AMBULATORY_CARE_PROVIDER_SITE_OTHER): Payer: Self-pay | Admitting: Vascular Surgery

## 2019-10-15 DIAGNOSIS — I6523 Occlusion and stenosis of bilateral carotid arteries: Secondary | ICD-10-CM

## 2019-10-16 ENCOUNTER — Encounter (INDEPENDENT_AMBULATORY_CARE_PROVIDER_SITE_OTHER): Payer: Self-pay | Admitting: Nurse Practitioner

## 2019-10-16 ENCOUNTER — Other Ambulatory Visit: Payer: Self-pay

## 2019-10-16 ENCOUNTER — Ambulatory Visit (INDEPENDENT_AMBULATORY_CARE_PROVIDER_SITE_OTHER): Payer: PPO | Admitting: Nurse Practitioner

## 2019-10-16 ENCOUNTER — Ambulatory Visit (INDEPENDENT_AMBULATORY_CARE_PROVIDER_SITE_OTHER): Payer: PPO

## 2019-10-16 VITALS — BP 158/74 | HR 88 | Ht 64.0 in | Wt 126.0 lb

## 2019-10-16 DIAGNOSIS — E785 Hyperlipidemia, unspecified: Secondary | ICD-10-CM | POA: Diagnosis not present

## 2019-10-16 DIAGNOSIS — I6523 Occlusion and stenosis of bilateral carotid arteries: Secondary | ICD-10-CM | POA: Diagnosis not present

## 2019-10-16 DIAGNOSIS — I10 Essential (primary) hypertension: Secondary | ICD-10-CM | POA: Diagnosis not present

## 2019-10-19 ENCOUNTER — Encounter (INDEPENDENT_AMBULATORY_CARE_PROVIDER_SITE_OTHER): Payer: Self-pay | Admitting: Nurse Practitioner

## 2019-10-19 NOTE — Progress Notes (Signed)
SUBJECTIVE:  Patient ID: Dominique King, female    DOB: December 21, 1930, 86 y.o.   MRN: [id number] Chief Complaint  Patient presents with  . Follow-up    U/S follow  up    HPI  Dominique King is a 86 y.o. female The patient is seen for follow up evaluation of carotid stenosis. The carotid stenosis followed by ultrasound.   The patient denies amaurosis fugax. There is no recent history of TIA symptoms or focal motor deficits. There is no prior documented CVA.  The patient is taking enteric-coated aspirin 81 mg daily.  There is no history of migraine headaches. There is no history of seizures.  The patient has a history of coronary artery disease, no recent episodes of angina or shortness of breath. The patient denies PAD or claudication symptoms. There is a history of hyperlipidemia which is being treated with a statin.    Carotid Duplex done today shows 1-39%.  No change compared to last study in 05/02/2018  Past Medical History:  Diagnosis Date  . Carotid artery occlusion   . Hypertension   . Ovarian cancer [facility])     Past Surgical History:  Procedure Laterality Date  . ABDOMINAL HYSTERECTOMY    . BREAST BIOPSY Right    neg-1994  . OOPHORECTOMY      Social History   Socioeconomic History  . Marital status: Widowed    Spouse name: Not on file  . Number of children: Not on file  . Years of education: Not on file  . Highest education level: Not on file  Occupational History  . Not on file  Tobacco Use  . Smoking status: Never Smoker  . Smokeless tobacco: Never Used  Vaping Use  . Vaping Use: Never used  Substance and Sexual Activity  . Alcohol use: No  . Drug use: No  . Sexual activity: Never  Other Topics Concern  . Not on file  Social History Narrative  . Not on file   Social Determinants of Health   Financial Resource Strain:   . Difficulty of Paying Living Expenses:   Food Insecurity:   . Worried About Charity fundraiser in the Last Year:     . Arboriculturist in the Last Year:   Transportation Needs:   . Film/video editor (Medical):   Marland Kitchen Lack of Transportation (Non-Medical):   Physical Activity:   . Days of Exercise per Week:   . Minutes of Exercise per Session:   Stress:   . Feeling of Stress :   Social Connections:   . Frequency of Communication with Friends and Family:   . Frequency of Social Gatherings with Friends and Family:   . Attends Religious Services:   . Active Member of Clubs or Organizations:   . Attends Archivist Meetings:   Marland Kitchen Marital Status:   Intimate Partner Violence:   . Fear of Current or Ex-Partner:   . Emotionally Abused:   Marland Kitchen Physically Abused:   . Sexually Abused:     Family History  Problem Relation Age of Onset  . Breast cancer Mother 70    Allergies  Allergen Reactions  . Atorvastatin     Other reaction(s): Muscle Pain, Other (See Comments)  . Raloxifene     Other reaction(s): Other (See Comments) Hot flashes  . Codeine Rash     Review of Systems   Review of Systems: Negative Unless Checked Constitutional: [] Weight loss  [] Fever  [] Chills Cardiac: [] Chest  pain   []  Atrial Fibrillation  [] Palpitations   [] Shortness of breath when laying flat   [] Shortness of breath with exertion. [] Shortness of breath at rest Vascular:  [] Pain in legs with walking   [] Pain in legs with standing [] Pain in legs when laying flat   [] Claudication    [] Pain in feet when laying flat    [] History of DVT   [] Phlebitis   [] Swelling in legs   [] Varicose veins   [] Non-healing ulcers Pulmonary:   [] Uses home oxygen   [] Productive cough   [] Hemoptysis   [] Wheeze  [] COPD   [] Asthma Neurologic:  [] Dizziness   [] Seizures  [] Blackouts [] History of stroke   [] History of TIA  [] Aphasia   [] Temporary Blindness   [] Weakness or numbness in arm   [] Weakness or numbness in leg Musculoskeletal:   [] Joint swelling   [] Joint pain   [] Low back pain  []  History of Knee Replacement [x] Arthritis [] back Surgeries   []  Spinal Stenosis    Hematologic:  [] Easy bruising  [] Easy bleeding   [] Hypercoagulable state   [] Anemic Gastrointestinal:  [] Diarrhea   [] Vomiting  [] Gastroesophageal reflux/heartburn   [] Difficulty swallowing. [] Abdominal pain Genitourinary:  [] Chronic kidney disease   [] Difficult urination  [] Anuric   [] Blood in urine [] Frequent urination  [] Burning with urination   [] Hematuria Skin:  [] Rashes   [] Ulcers [] Wounds Psychological:  [] History of anxiety   []  History of major depression  []  Memory Difficulties      OBJECTIVE:   Physical Exam  BP (!) 158/74   Pulse 88   Ht 5\' 4"  (1.626 m)   Wt 126 lb (57.2 kg)   BMI 21.63 kg/m   Gen: WD/WN, NAD Head: [place]/AT, No temporalis wasting.  Ear/Nose/Throat: Hearing grossly intact, nares w/o erythema or drainage Eyes: PER, EOMI, sclera nonicteric.  Neck: Supple, no masses.  No JVD.  Pulmonary:  Good air movement, no use of accessory muscles.  Cardiac: RRR Vascular:  Vessel Right Left  Radial Palpable Palpable   Gastrointestinal: soft, non-distended. No guarding/no peritoneal signs.  Musculoskeletal: M/S 5/5 throughout.  No deformity or atrophy.  Neurologic: Pain and light touch intact in extremities.  Symmetrical.  Speech is fluent. Motor exam as listed above. Psychiatric: Judgment intact, Mood & affect appropriate for pt's clinical situation. Dermatologic: No Venous rashes. No Ulcers Noted.  No changes consistent with cellulitis. Lymph : No Cervical lymphadenopathy, no lichenification or skin changes of chronic lymphedema.       ASSESSMENT AND PLAN:  1. Bilateral carotid artery stenosis Recommend:  Given the patient's asymptomatic subcritical stenosis no further invasive testing or surgery at this time.  Duplex ultrasound shows 12 stenosis bilaterally.  Continue antiplatelet therapy as prescribed Continue management of CAD, HTN and Hyperlipidemia Healthy heart diet,  encouraged exercise at least 4 times per week Follow up in  1-39% months with duplex ultrasound and physical exam   2. Essential hypertension Continue antihypertensive medications as already ordered, these medications have been reviewed and there are no changes at this time.   3. Hyperlipidemia, unspecified hyperlipidemia type Continue statin as ordered and reviewed, no changes at this time    Current Outpatient Medications on File Prior to Visit  Medication Sig Dispense Refill  . aspirin 81 MG tablet Take 81 mg by mouth daily.    . citalopram (CELEXA) 20 MG tablet Take 20 mg by mouth daily.    Marland Kitchen ibuprofen (ADVIL,MOTRIN) 200 MG tablet Take by mouth.    . melatonin 3 MG TABS tablet Take by  mouth.    . Omega-3 Fatty Acids (FISH OIL ADULT GUMMIES PO) Take by mouth.    . pravastatin (PRAVACHOL) 10 MG tablet Take 10 mg by mouth daily.    . verapamil (VERELAN PM) 240 MG 24 hr capsule Take 240 mg by mouth at bedtime.    . gabapentin (NEURONTIN) 300 MG capsule Take 600 mg by mouth at bedtime.  (Patient not taking: Reported on 10/16/2019)     No current facility-administered medications on file prior to visit.    There are no Patient Instructions on file for this visit. No follow-ups on file.   Kris Hartmann, NP  This note was completed with Sales executive.  Any errors are purely unintentional.

## 2019-11-05 IMAGING — MG MM DIGITAL SCREENING BILAT W/ TOMO W/ CAD
8 series · 8 of 24 positions shown · non-contrast
Comparison: Previous exam(s).

CLINICAL DATA: Screening.

EXAM:
DIGITAL SCREENING BILATERAL MAMMOGRAM WITH TOMO AND CAD

[R CC synth-2D]
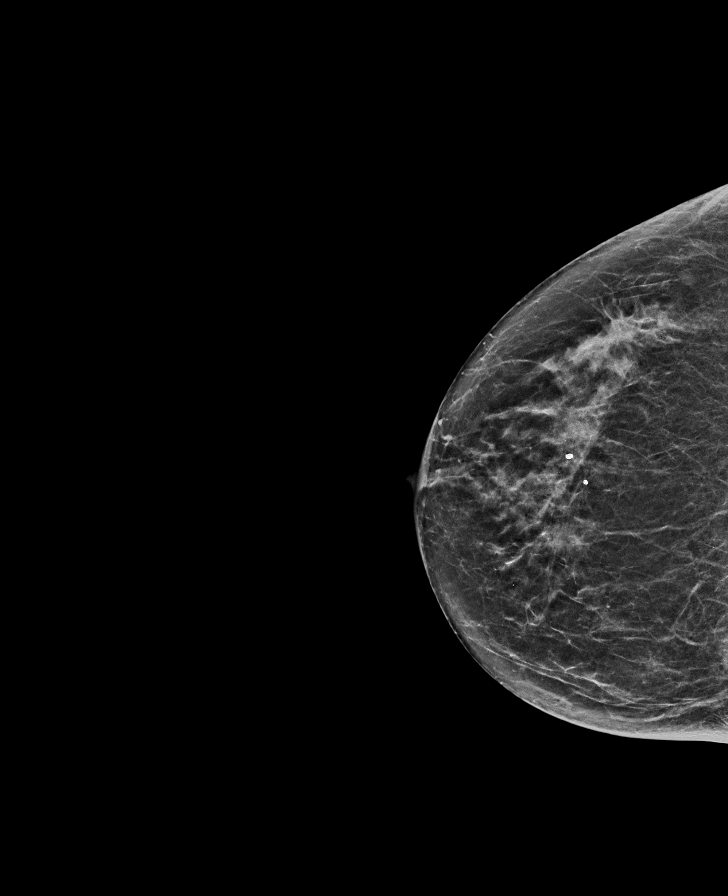

[R MLO synth-2D]
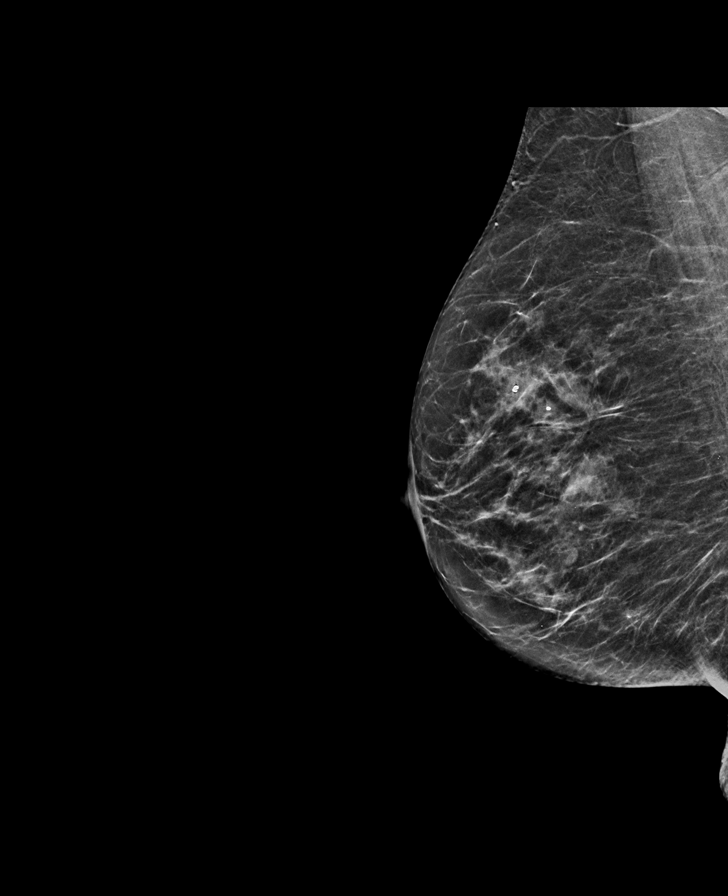

[L CC synth-2D]
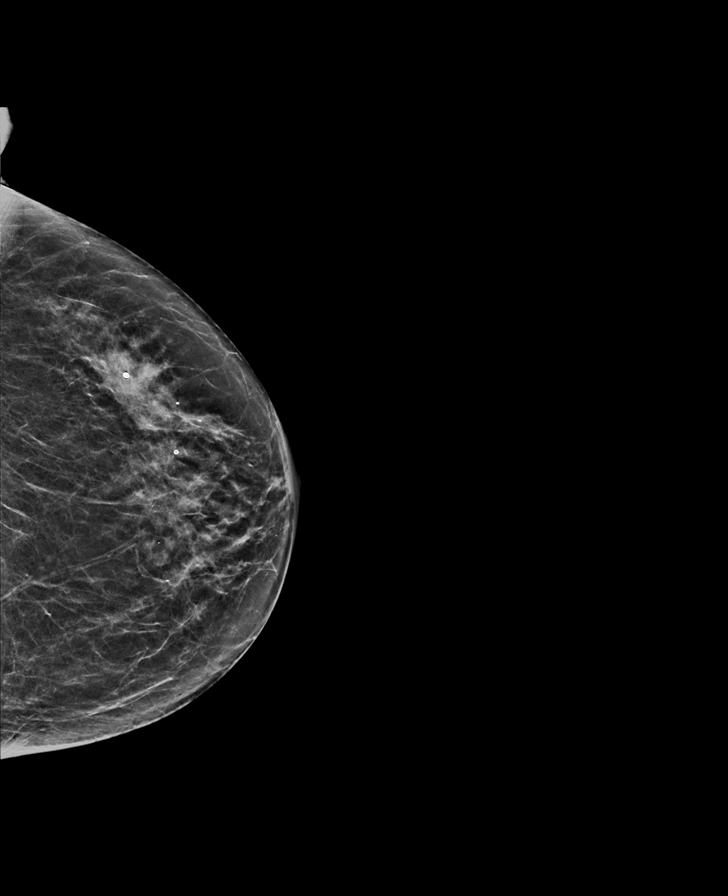

[L MLO synth-2D]
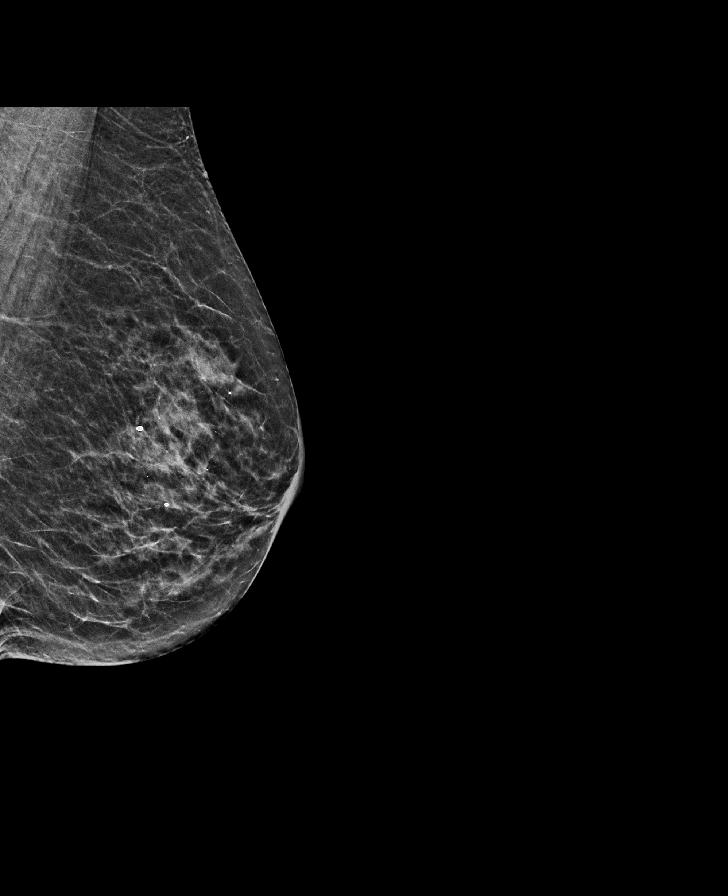

[L MLO tomo · tomo slice 25/50.0]
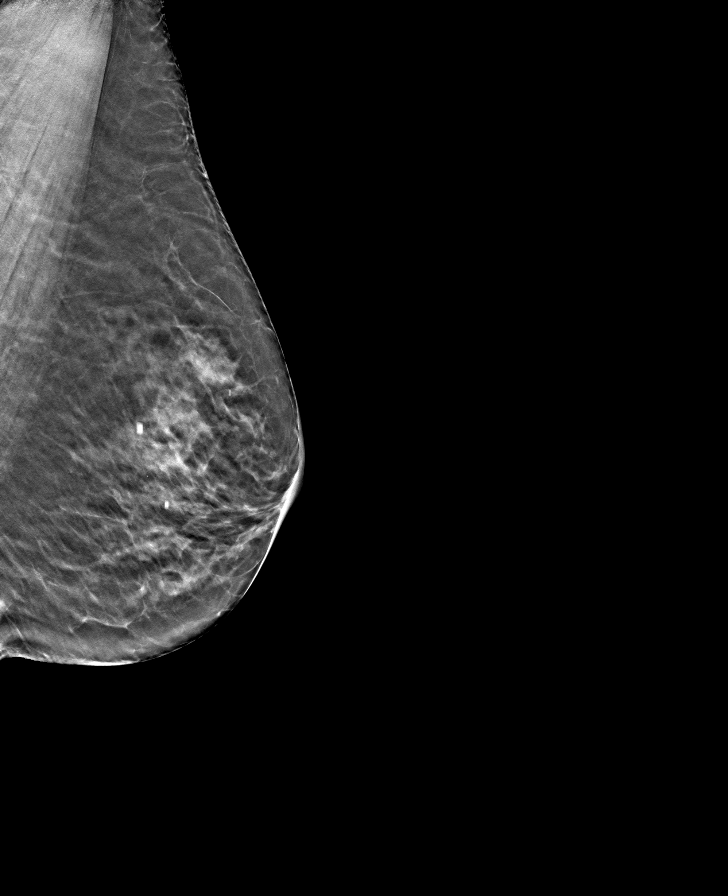

[L CC tomo · tomo slice 25/49.0]
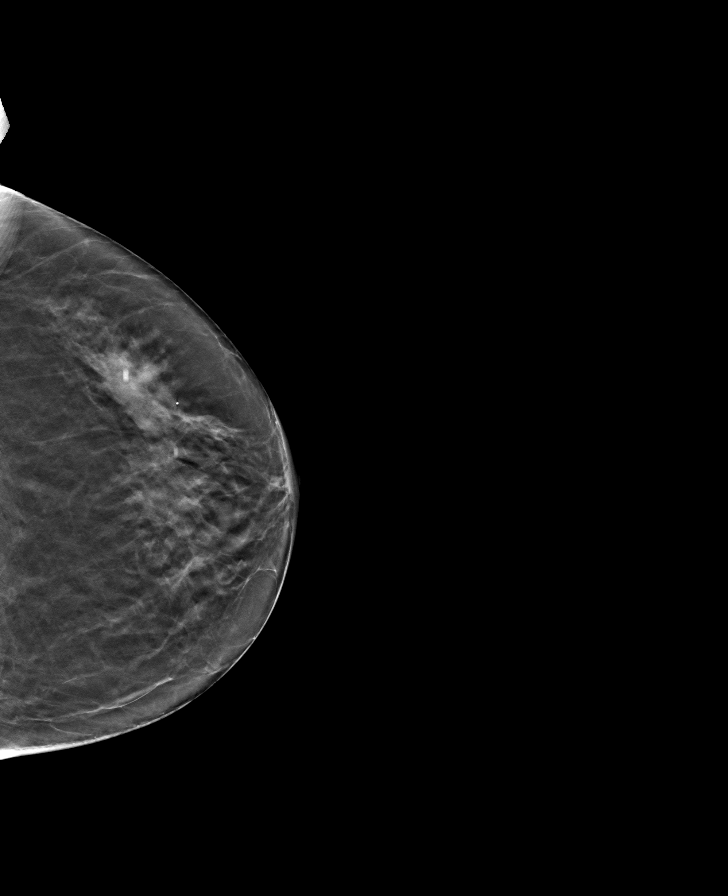

[R CC tomo · tomo slice 27/53.0]
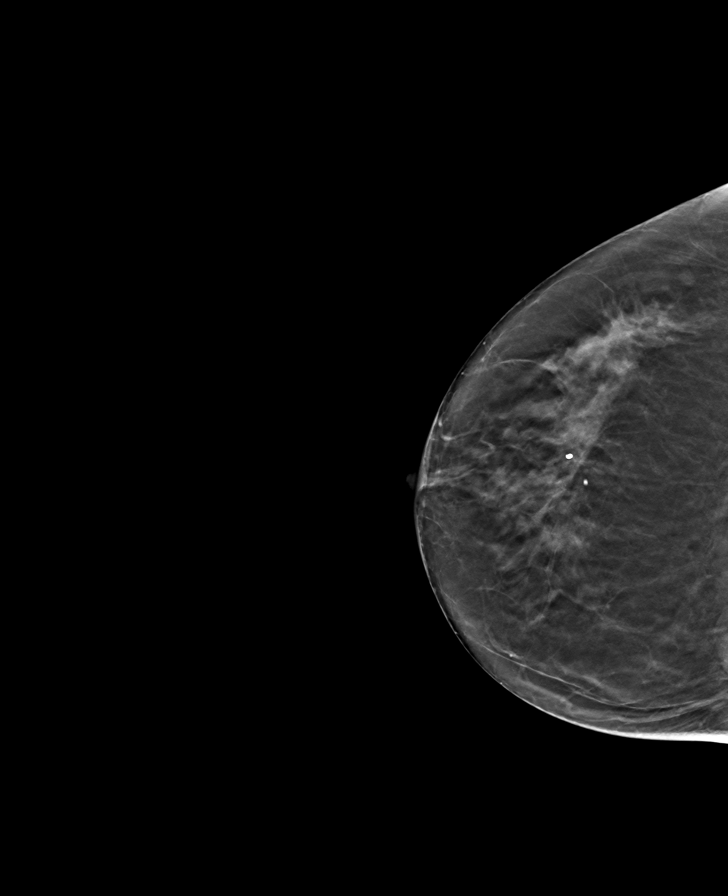

[R MLO tomo · tomo slice 27/54.0]
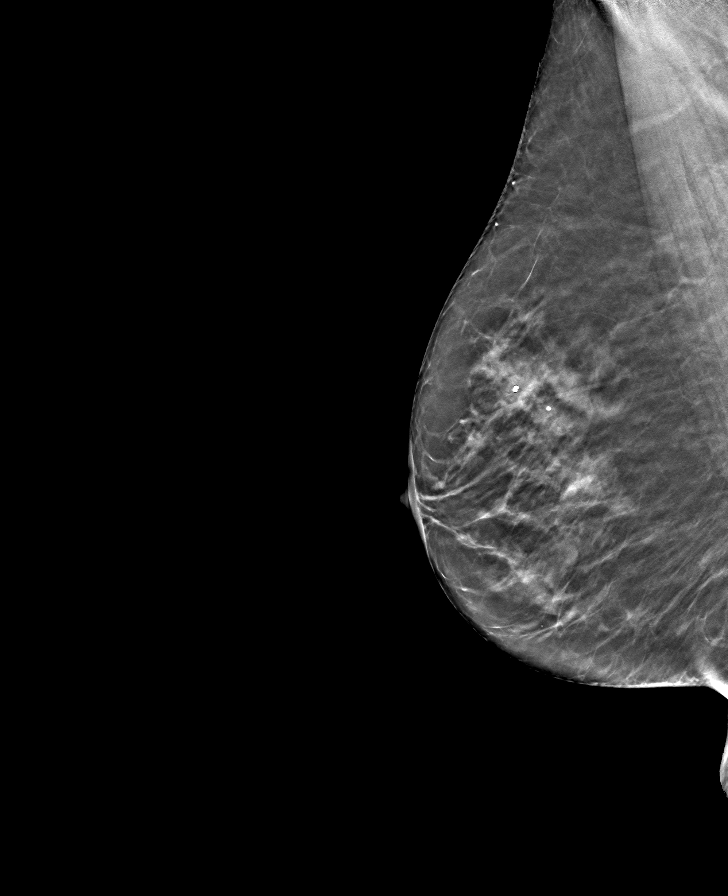

[8 of 24 positions shown; findings below may reference images not displayed]

ACR Breast Density Category b: There are scattered areas of
fibroglandular density.
FINDINGS: There are no findings suspicious for malignancy. Images were
processed with CAD.
IMPRESSION: No mammographic evidence of malignancy. A result letter of this
screening mammogram will be mailed directly to the patient.

RECOMMENDATION:
Screening mammogram in one year. (Code:[id number])

BI-RADS CATEGORY  1: Negative.

## 2019-11-11 ENCOUNTER — Other Ambulatory Visit: Payer: Self-pay

## 2019-11-11 ENCOUNTER — Ambulatory Visit
Admission: RE | Admit: 2019-11-11 | Discharge: 2019-11-11 | Disposition: A | Discharge status: Home / Self Care | Payer: PPO | Source: Ambulatory Visit | Attending: Family Medicine | Admitting: Family Medicine

## 2019-11-11 DIAGNOSIS — Z1231 Encounter for screening mammogram for malignant neoplasm of breast: Secondary | ICD-10-CM

## 2020-04-08 DIAGNOSIS — M129 Arthropathy, unspecified: Secondary | ICD-10-CM | POA: Diagnosis not present

## 2020-04-08 DIAGNOSIS — I10 Essential (primary) hypertension: Secondary | ICD-10-CM | POA: Diagnosis not present

## 2020-04-08 DIAGNOSIS — E7800 Pure hypercholesterolemia, unspecified: Secondary | ICD-10-CM | POA: Diagnosis not present

## 2020-04-08 DIAGNOSIS — N1831 Chronic kidney disease, stage 3a: Secondary | ICD-10-CM | POA: Diagnosis not present

## 2020-04-08 DIAGNOSIS — I6523 Occlusion and stenosis of bilateral carotid arteries: Secondary | ICD-10-CM | POA: Diagnosis not present

## 2020-04-08 DIAGNOSIS — F419 Anxiety disorder, unspecified: Secondary | ICD-10-CM | POA: Diagnosis not present

## 2020-04-08 DIAGNOSIS — R1310 Dysphagia, unspecified: Secondary | ICD-10-CM | POA: Diagnosis not present

## 2020-04-15 ENCOUNTER — Other Ambulatory Visit: Payer: Self-pay | Admitting: Family Medicine

## 2020-04-15 DIAGNOSIS — R1310 Dysphagia, unspecified: Secondary | ICD-10-CM

## 2020-05-18 DIAGNOSIS — J069 Acute upper respiratory infection, unspecified: Secondary | ICD-10-CM | POA: Diagnosis not present

## 2020-06-03 ENCOUNTER — Ambulatory Visit: Payer: PPO | Attending: Family Medicine

## 2020-10-17 ENCOUNTER — Other Ambulatory Visit (INDEPENDENT_AMBULATORY_CARE_PROVIDER_SITE_OTHER): Payer: Self-pay | Admitting: Nurse Practitioner

## 2020-10-17 DIAGNOSIS — I6529 Occlusion and stenosis of unspecified carotid artery: Secondary | ICD-10-CM

## 2020-10-18 ENCOUNTER — Other Ambulatory Visit: Payer: Self-pay

## 2020-10-18 ENCOUNTER — Ambulatory Visit (INDEPENDENT_AMBULATORY_CARE_PROVIDER_SITE_OTHER): Payer: PPO | Admitting: Vascular Surgery

## 2020-10-18 ENCOUNTER — Ambulatory Visit (INDEPENDENT_AMBULATORY_CARE_PROVIDER_SITE_OTHER): Payer: PPO

## 2020-10-18 VITALS — BP 171/60 | HR 88 | Resp 16 | Wt 113.8 lb

## 2020-10-18 DIAGNOSIS — I6529 Occlusion and stenosis of unspecified carotid artery: Secondary | ICD-10-CM

## 2020-10-18 DIAGNOSIS — E785 Hyperlipidemia, unspecified: Secondary | ICD-10-CM | POA: Diagnosis not present

## 2020-10-18 DIAGNOSIS — I6523 Occlusion and stenosis of bilateral carotid arteries: Secondary | ICD-10-CM

## 2020-10-18 DIAGNOSIS — I10 Essential (primary) hypertension: Secondary | ICD-10-CM

## 2020-10-18 NOTE — Assessment & Plan Note (Signed)
blood pressure control important in reducing the progression of atherosclerotic disease. On appropriate oral medications.  

## 2020-10-18 NOTE — Assessment & Plan Note (Signed)
lipid control important in reducing the progression of atherosclerotic disease. Continue statin therapy  

## 2020-10-18 NOTE — Progress Notes (Signed)
MRN : [id number]  Dominique King is a 87 y.o. (1930/09/23) female who presents with chief complaint of  Chief Complaint  Patient presents with   Follow-up    Ultrasound follow up  .  History of Present Illness: Patient returns in follow up of her carotid disease.  She is doing well without complaints. No focal neurologic symptoms. Specifically, the patient denies amaurosis fugax, speech or swallowing difficulties, or arm or leg weakness or numbness.  Carotid duplex today reveals stable 1 to 39% ICA stenosis bilaterally without progression from previous studies.   Current Outpatient Medications  Medication Sig Dispense Refill   aspirin 81 MG tablet Take 81 mg by mouth daily.     citalopram (CELEXA) 20 MG tablet Take 20 mg by mouth daily.     ibuprofen (ADVIL,MOTRIN) 200 MG tablet Take by mouth.     melatonin 3 MG TABS tablet Take by mouth.     Omega-3 Fatty Acids (FISH OIL ADULT GUMMIES PO) Take by mouth.     pravastatin (PRAVACHOL) 10 MG tablet Take 10 mg by mouth daily.     verapamil (VERELAN PM) 240 MG 24 hr capsule Take 240 mg by mouth at bedtime.     gabapentin (NEURONTIN) 300 MG capsule Take 600 mg by mouth at bedtime.  (Patient not taking: No sig reported)     No current facility-administered medications for this visit.    Past Medical History:  Diagnosis Date   Carotid artery occlusion    Hypertension    Ovarian cancer [facility])     Past Surgical History:  Procedure Laterality Date   ABDOMINAL HYSTERECTOMY     BREAST BIOPSY Right    neg-1994   OOPHORECTOMY       Social History   Tobacco Use   Smoking status: Never   Smokeless tobacco: Never  Vaping Use   Vaping Use: Never used  Substance Use Topics   Alcohol use: No   Drug use: No       Family History  Problem Relation Age of Onset   Breast cancer Mother 1     Allergies  Allergen Reactions   Atorvastatin     Other reaction(s): Muscle Pain, Other (See Comments)   Raloxifene     Other  reaction(s): Other (See Comments) Hot flashes   Codeine Rash     REVIEW OF SYSTEMS (Negative unless checked)  Constitutional: [] Weight loss  [] Fever  [] Chills Cardiac: [] Chest pain   [] Chest pressure   [] Palpitations   [] Shortness of breath when laying flat   [] Shortness of breath at rest   [] Shortness of breath with exertion. Vascular:  [] Pain in legs with walking   [] Pain in legs at rest   [] Pain in legs when laying flat   [] Claudication   [] Pain in feet when walking  [] Pain in feet at rest  [] Pain in feet when laying flat   [] History of DVT   [] Phlebitis   [] Swelling in legs   [] Varicose veins   [] Non-healing ulcers Pulmonary:   [] Uses home oxygen   [] Productive cough   [] Hemoptysis   [] Wheeze  [] COPD   [] Asthma Neurologic:  [] Dizziness  [] Blackouts   [] Seizures   [] History of stroke   [] History of TIA  [] Aphasia   [] Temporary blindness   [] Dysphagia   [] Weakness or numbness in arms   [] Weakness or numbness in legs Musculoskeletal:  [x] Arthritis   [] Joint swelling   [x] Joint pain   [] Low back pain Hematologic:  [] Easy bruising  []   Easy bleeding   [] Hypercoagulable state   [] Anemic  [] Hepatitis Gastrointestinal:  [] Blood in stool   [] Vomiting blood  [] Gastroesophageal reflux/heartburn   [] Difficulty swallowing. Genitourinary:  [] Chronic kidney disease   [] Difficult urination  [] Frequent urination  [] Burning with urination   [] Blood in urine Skin:  [] Rashes   [] Ulcers   [] Wounds Psychological:  [] History of anxiety   []  History of major depression.  Physical Examination  Vitals:   10/18/20 1329  BP: (!) 171/60  Pulse: 88  Resp: 16  Weight: 113 lb 12.8 oz (51.6 kg)   Body mass index is 19.53 kg/m. Gen:  WD/WN, NAD. Appears younger than stated age. Head: [place]/AT, No temporalis wasting. Ear/Nose/Throat: Hearing grossly intact, nares w/o erythema or drainage, trachea midline Eyes: Conjunctiva clear. Sclera non-icteric Neck: Supple.  No bruit  Pulmonary:  Good air movement, equal and  clear to auscultation bilaterally.  Cardiac: RRR, No JVD Vascular:  Vessel Right Left  Radial Palpable Palpable           Musculoskeletal: M/S 5/5 throughout.  No deformity or atrophy. No edema. Neurologic: CN 2-12 intact. Sensation grossly intact in extremities.  Symmetrical.  Speech is fluent. Motor exam as listed above. Psychiatric: Judgment intact, Mood & affect appropriate for pt's clinical situation. Dermatologic: No rashes or ulcers noted.  No cellulitis or open wounds.     CBC Lab Results  Component Value Date   WBC 10.5 11/28/2016   HGB 14.4 11/28/2016   HCT 42.1 11/28/2016   MCV 88.8 11/28/2016   PLT 297 11/28/2016    BMET    Component Value Date/Time   NA 138 11/28/2016 1349   K 4.5 11/28/2016 1349   CL 105 11/28/2016 1349   CO2 23 11/28/2016 1349   GLUCOSE 121 (H) 11/28/2016 1349   BUN 29 (H) 11/28/2016 1349   CREATININE 1.02 (H) 11/28/2016 1349   CALCIUM 9.0 11/28/2016 1349   GFRNONAA 49 (L) 11/28/2016 1349   GFRAA 57 (L) 11/28/2016 1349   CrCl cannot be calculated (Patient's most recent lab result is older than the maximum 21 days allowed.).  COAG No results found for: INR, PROTIME  Radiology No results found.   Assessment/Plan Essential hypertension blood pressure control important in reducing the progression of atherosclerotic disease. On appropriate oral medications.   Hyperlipidemia lipid control important in reducing the progression of atherosclerotic disease. Continue statin therapy   Carotid stenosis Carotid duplex today reveals stable 1 to 39% ICA stenosis bilaterally without progression from previous studies.  Continue aspirin and statin agent.  Recheck in 1 year    Leotis Pain, MD  10/18/2020 2:55 PM    This note was created with Dragon medical transcription system.  Any errors from dictation are purely unintentional

## 2020-10-18 NOTE — Assessment & Plan Note (Signed)
Carotid duplex today reveals stable 1 to 39% ICA stenosis bilaterally without progression from previous studies.  Continue aspirin and statin agent.  Recheck in 1 year

## 2020-10-19 DIAGNOSIS — Z0000 Encounter for general adult medical examination without abnormal findings: Secondary | ICD-10-CM | POA: Diagnosis not present

## 2020-10-19 DIAGNOSIS — E7800 Pure hypercholesterolemia, unspecified: Secondary | ICD-10-CM | POA: Diagnosis not present

## 2020-10-19 DIAGNOSIS — M129 Arthropathy, unspecified: Secondary | ICD-10-CM | POA: Diagnosis not present

## 2020-10-19 DIAGNOSIS — F419 Anxiety disorder, unspecified: Secondary | ICD-10-CM | POA: Diagnosis not present

## 2020-10-19 DIAGNOSIS — N1831 Chronic kidney disease, stage 3a: Secondary | ICD-10-CM | POA: Diagnosis not present

## 2020-10-19 DIAGNOSIS — I6523 Occlusion and stenosis of bilateral carotid arteries: Secondary | ICD-10-CM | POA: Diagnosis not present

## 2020-10-19 DIAGNOSIS — I10 Essential (primary) hypertension: Secondary | ICD-10-CM | POA: Diagnosis not present

## 2020-12-26 ENCOUNTER — Other Ambulatory Visit: Payer: Self-pay | Admitting: Family Medicine

## 2020-12-26 DIAGNOSIS — Z1231 Encounter for screening mammogram for malignant neoplasm of breast: Secondary | ICD-10-CM

## 2021-01-10 ENCOUNTER — Ambulatory Visit
Admission: RE | Admit: 2021-01-10 | Discharge: 2021-01-10 | Disposition: A | Discharge status: Home / Self Care | Payer: Medicare Other | Source: Ambulatory Visit | Attending: Family Medicine | Admitting: Family Medicine

## 2021-01-10 ENCOUNTER — Other Ambulatory Visit: Payer: Self-pay

## 2021-01-10 DIAGNOSIS — Z1231 Encounter for screening mammogram for malignant neoplasm of breast: Secondary | ICD-10-CM | POA: Diagnosis not present

## 2021-10-31 ENCOUNTER — Other Ambulatory Visit (INDEPENDENT_AMBULATORY_CARE_PROVIDER_SITE_OTHER): Payer: Self-pay | Admitting: Nurse Practitioner

## 2021-10-31 ENCOUNTER — Ambulatory Visit (INDEPENDENT_AMBULATORY_CARE_PROVIDER_SITE_OTHER): Payer: Medicare Other

## 2021-10-31 ENCOUNTER — Ambulatory Visit (INDEPENDENT_AMBULATORY_CARE_PROVIDER_SITE_OTHER): Payer: Medicare Other | Admitting: Vascular Surgery

## 2021-10-31 ENCOUNTER — Encounter (INDEPENDENT_AMBULATORY_CARE_PROVIDER_SITE_OTHER): Payer: Self-pay | Admitting: Vascular Surgery

## 2021-10-31 VITALS — BP 117/61 | HR 78 | Resp 16 | Ht 64.0 in | Wt 126.0 lb

## 2021-10-31 DIAGNOSIS — I10 Essential (primary) hypertension: Secondary | ICD-10-CM | POA: Diagnosis not present

## 2021-10-31 DIAGNOSIS — I6523 Occlusion and stenosis of bilateral carotid arteries: Secondary | ICD-10-CM | POA: Diagnosis not present

## 2021-10-31 DIAGNOSIS — E785 Hyperlipidemia, unspecified: Secondary | ICD-10-CM

## 2021-10-31 NOTE — Assessment & Plan Note (Signed)
Duplex today shows slight progression of her right ICA velocities now just into the 40 to 59% range.  Her left ICA velocities remain in the 1 to 39% range. Doing well.  No role for intervention at these levels.  Continue aspirin and statin agent.  Recheck in 1 year

## 2021-12-11 ENCOUNTER — Other Ambulatory Visit: Payer: Self-pay | Admitting: Family Medicine

## 2021-12-11 DIAGNOSIS — Z1231 Encounter for screening mammogram for malignant neoplasm of breast: Secondary | ICD-10-CM

## 2022-01-11 ENCOUNTER — Ambulatory Visit
Admission: RE | Admit: 2022-01-11 | Discharge: 2022-01-11 | Disposition: A | Discharge status: Home / Self Care | Payer: Medicare Other | Source: Ambulatory Visit | Attending: Family Medicine | Admitting: Family Medicine

## 2022-01-11 DIAGNOSIS — Z1231 Encounter for screening mammogram for malignant neoplasm of breast: Secondary | ICD-10-CM | POA: Insufficient documentation

## 2022-10-29 ENCOUNTER — Other Ambulatory Visit (INDEPENDENT_AMBULATORY_CARE_PROVIDER_SITE_OTHER): Payer: Self-pay | Admitting: Vascular Surgery

## 2022-10-29 DIAGNOSIS — I6523 Occlusion and stenosis of bilateral carotid arteries: Secondary | ICD-10-CM

## 2022-11-02 ENCOUNTER — Ambulatory Visit (INDEPENDENT_AMBULATORY_CARE_PROVIDER_SITE_OTHER): Payer: PPO | Admitting: Vascular Surgery

## 2022-11-02 ENCOUNTER — Encounter (INDEPENDENT_AMBULATORY_CARE_PROVIDER_SITE_OTHER): Payer: PPO

## 2022-12-04 ENCOUNTER — Other Ambulatory Visit: Payer: Self-pay | Admitting: Family Medicine

## 2022-12-04 DIAGNOSIS — Z1231 Encounter for screening mammogram for malignant neoplasm of breast: Secondary | ICD-10-CM

## 2023-01-14 ENCOUNTER — Ambulatory Visit
Admission: RE | Admit: 2023-01-14 | Discharge: 2023-01-14 | Disposition: A | Discharge status: Home / Self Care | Payer: Medicare Other | Source: Ambulatory Visit | Attending: Family Medicine | Admitting: Family Medicine

## 2023-01-14 DIAGNOSIS — Z1231 Encounter for screening mammogram for malignant neoplasm of breast: Secondary | ICD-10-CM | POA: Insufficient documentation

## 2023-05-17 ENCOUNTER — Ambulatory Visit (INDEPENDENT_AMBULATORY_CARE_PROVIDER_SITE_OTHER): Payer: PPO | Admitting: Vascular Surgery

## 2023-05-17 ENCOUNTER — Encounter (INDEPENDENT_AMBULATORY_CARE_PROVIDER_SITE_OTHER): Payer: PPO

## 2023-06-21 ENCOUNTER — Encounter (INDEPENDENT_AMBULATORY_CARE_PROVIDER_SITE_OTHER): Payer: PPO

## 2023-06-21 ENCOUNTER — Ambulatory Visit (INDEPENDENT_AMBULATORY_CARE_PROVIDER_SITE_OTHER): Payer: PPO | Admitting: Vascular Surgery

## 2023-11-20 ENCOUNTER — Other Ambulatory Visit (INDEPENDENT_AMBULATORY_CARE_PROVIDER_SITE_OTHER): Payer: Self-pay | Admitting: Vascular Surgery

## 2023-11-20 DIAGNOSIS — I6523 Occlusion and stenosis of bilateral carotid arteries: Secondary | ICD-10-CM

## 2023-11-26 ENCOUNTER — Ambulatory Visit (INDEPENDENT_AMBULATORY_CARE_PROVIDER_SITE_OTHER): Admitting: Vascular Surgery

## 2023-11-26 ENCOUNTER — Ambulatory Visit (INDEPENDENT_AMBULATORY_CARE_PROVIDER_SITE_OTHER)

## 2023-11-26 VITALS — BP 162/67 | HR 69 | Ht 64.0 in | Wt 119.8 lb

## 2023-11-26 DIAGNOSIS — I6523 Occlusion and stenosis of bilateral carotid arteries: Secondary | ICD-10-CM | POA: Diagnosis not present

## 2023-11-26 DIAGNOSIS — I10 Essential (primary) hypertension: Secondary | ICD-10-CM

## 2023-11-26 DIAGNOSIS — E785 Hyperlipidemia, unspecified: Secondary | ICD-10-CM | POA: Diagnosis not present

## 2023-11-26 NOTE — Patient Instructions (Signed)
 He has not answered back to me text yet I added on Nena Shed for more I just asked him eligibility says

## 2023-11-26 NOTE — Progress Notes (Signed)
 MRN : [id number]  Dominique King is a 89 y.o. ([birth year]) female who presents with chief complaint of  Chief Complaint  Patient presents with   Follow-up     1 year follow up + Carotid  .  History of Present Illness: Patient returns in follow-up of her carotid disease.  She is doing well.  She denies any focal neurologic symptoms. Specifically, the patient denies amaurosis fugax, speech or swallowing difficulties, or arm or leg weakness or numbness.  Carotid duplex today shows stable velocities with velocities of follow-up upper end of the 1 to 39% range bilaterally.  Current Outpatient Medications  Medication Sig Dispense Refill   aspirin 81 MG tablet Take 81 mg by mouth daily.     citalopram (CELEXA) 20 MG tablet Take 20 mg by mouth daily.     ibuprofen (ADVIL,MOTRIN) 200 MG tablet Take by mouth.     melatonin 3 MG TABS tablet Take by mouth.     Omega-3 Fatty Acids (FISH OIL ADULT GUMMIES PO) Take by mouth.     pravastatin (PRAVACHOL) 10 MG tablet Take 10 mg by mouth daily.     verapamil (VERELAN PM) 240 MG 24 hr capsule Take 240 mg by mouth at bedtime.     gabapentin (NEURONTIN) 300 MG capsule Take 600 mg by mouth at bedtime.  (Patient not taking: Reported on 11/26/2023)     No current facility-administered medications for this visit.    Past Medical History:  Diagnosis Date   Carotid artery occlusion    Hypertension    Ovarian cancer [facility])     Past Surgical History:  Procedure Laterality Date   ABDOMINAL HYSTERECTOMY     BREAST BIOPSY Right    neg-1994   OOPHORECTOMY       Social History   Tobacco Use   Smoking status: Never   Smokeless tobacco: Never  Vaping Use   Vaping status: Never Used  Substance Use Topics   Alcohol use: No   Drug use: No      Family History  Problem Relation Age of Onset   Breast cancer Mother 65     Allergies  Allergen Reactions   Atorvastatin     Other reaction(s): Muscle Pain, Other (See Comments)   Raloxifene      Other reaction(s): Other (See Comments) Hot flashes   Codeine Rash     REVIEW OF SYSTEMS (Negative unless checked)  Constitutional: [] Weight loss  [] Fever  [] Chills Cardiac: [] Chest pain   [] Chest pressure   [] Palpitations   [] Shortness of breath when laying flat   [] Shortness of breath at rest   [] Shortness of breath with exertion. Vascular:  [] Pain in legs with walking   [] Pain in legs at rest   [] Pain in legs when laying flat   [] Claudication   [] Pain in feet when walking  [] Pain in feet at rest  [] Pain in feet when laying flat   [] History of DVT   [] Phlebitis   [] Swelling in legs   [] Varicose veins   [] Non-healing ulcers Pulmonary:   [] Uses home oxygen   [] Productive cough   [] Hemoptysis   [] Wheeze  [] COPD   [] Asthma Neurologic:  [] Dizziness  [] Blackouts   [] Seizures   [] History of stroke   [] History of TIA  [] Aphasia   [] Temporary blindness   [] Dysphagia   [] Weakness or numbness in arms   [] Weakness or numbness in legs Musculoskeletal:  [x] Arthritis   [] Joint swelling   [x] Joint pain   [] Low back pain  Hematologic:  [] Easy bruising  [] Easy bleeding   [] Hypercoagulable state   [] Anemic  [] Hepatitis Gastrointestinal:  [] Blood in stool   [] Vomiting blood  [] Gastroesophageal reflux/heartburn   [] Difficulty swallowing. Genitourinary:  [x] Chronic kidney disease   [] Difficult urination  [] Frequent urination  [] Burning with urination   [] Blood in urine Skin:  [] Rashes   [] Ulcers   [] Wounds Psychological:  [] History of anxiety   []  History of major depression.  Physical Examination  Vitals:   11/26/23 1543  BP: (!) 162/67  Pulse: 69  Weight: 119 lb 12.8 oz (54.3 kg)  Height: 5' 4 (1.626 m)   Body mass index is 20.56 kg/m. Gen:  WD/WN, NAD. Appears younger than stated age. Head: [place]/AT, No temporalis wasting. Ear/Nose/Throat: Hearing grossly intact, nares w/o erythema or drainage, trachea midline Eyes: Conjunctiva clear. Sclera non-icteric Neck: Supple.  No bruit  Pulmonary:  Good air  movement, equal and clear to auscultation bilaterally.  Cardiac: RRR, No JVD Vascular:  Vessel Right Left  Radial Palpable Palpable       Musculoskeletal: M/S 5/5 throughout.  No deformity or atrophy. No edema. Neurologic: CN 2-12 intact. Sensation grossly intact in extremities.  Symmetrical.  Speech is fluent. Motor exam as listed above. Psychiatric: Judgment intact, Mood & affect appropriate for pt's clinical situation. Dermatologic: No rashes or ulcers noted.  No cellulitis or open wounds.     CBC Lab Results  Component Value Date   WBC 10.5 11/28/2016   HGB 14.4 11/28/2016   HCT 42.1 11/28/2016   MCV 88.8 11/28/2016   PLT 297 11/28/2016    BMET    Component Value Date/Time   NA 138 11/28/2016 1349   K 4.5 11/28/2016 1349   CL 105 11/28/2016 1349   CO2 23 11/28/2016 1349   GLUCOSE 121 (H) 11/28/2016 1349   BUN 29 (H) 11/28/2016 1349   CREATININE 1.02 (H) 11/28/2016 1349   CALCIUM 9.0 11/28/2016 1349   GFRNONAA 49 (L) 11/28/2016 1349   GFRAA 57 (L) 11/28/2016 1349   CrCl cannot be calculated (Patient's most recent lab result is older than the maximum 21 days allowed.).  COAG No results found for: INR, PROTIME  Radiology No results found.   Assessment/Plan Carotid stenosis Carotid duplex today shows stable velocities with velocities of follow-up upper end of the 1 to 39% range bilaterally.  Continue aspirin and pravachol.  Recheck in 1 year.  Essential hypertension blood pressure control important in reducing the progression of atherosclerotic disease. On appropriate oral medications.     Hyperlipidemia lipid control important in reducing the progression of atherosclerotic disease. Continue statin therapy  Selinda Gu, MD  11/26/2023 4:05 PM    This note was created with Dragon medical transcription system.  Any errors from dictation are purely unintentional

## 2023-11-26 NOTE — Assessment & Plan Note (Signed)
 Carotid duplex today shows stable velocities with velocities of follow-up upper end of the 1 to 39% range bilaterally.  Continue aspirin and pravachol.  Recheck in 1 year.

## 2023-12-06 ENCOUNTER — Other Ambulatory Visit: Payer: Self-pay | Admitting: Family Medicine

## 2023-12-06 DIAGNOSIS — Z1231 Encounter for screening mammogram for malignant neoplasm of breast: Secondary | ICD-10-CM

## 2024-01-15 ENCOUNTER — Ambulatory Visit
Admission: RE | Admit: 2024-01-15 | Discharge: 2024-01-15 | Disposition: A | Discharge status: Home / Self Care | Source: Ambulatory Visit | Attending: Family Medicine | Admitting: Family Medicine

## 2024-01-15 DIAGNOSIS — Z1231 Encounter for screening mammogram for malignant neoplasm of breast: Secondary | ICD-10-CM | POA: Diagnosis present

## 2024-11-24 ENCOUNTER — Encounter (INDEPENDENT_AMBULATORY_CARE_PROVIDER_SITE_OTHER)

## 2024-11-24 ENCOUNTER — Ambulatory Visit (INDEPENDENT_AMBULATORY_CARE_PROVIDER_SITE_OTHER): Admitting: Vascular Surgery
